# Patient Record
Sex: Male | Born: 1980 | Race: White | Hispanic: No | Marital: Married | Smoking: Never smoker
Health system: Southern US, Community
[De-identification: ages and names within clinical notes are randomized; demographics above are authoritative.]

## PROBLEM LIST (undated history)

## (undated) DIAGNOSIS — M4310 Spondylolisthesis, site unspecified: Secondary | ICD-10-CM

## (undated) HISTORY — DX: Spondylolisthesis, site unspecified: M43.10

---

## 2002-12-16 ENCOUNTER — Encounter: Payer: Self-pay | Admitting: Pulmonary Disease

## 2002-12-30 ENCOUNTER — Ambulatory Visit (HOSPITAL_BASED_OUTPATIENT_CLINIC_OR_DEPARTMENT_OTHER): Admission: RE | Admit: 2002-12-30 | Discharge: 2002-12-30 | Payer: Self-pay | Admitting: Pulmonary Disease

## 2002-12-30 ENCOUNTER — Encounter: Payer: Self-pay | Admitting: Pulmonary Disease

## 2002-12-31 ENCOUNTER — Encounter: Payer: Self-pay | Admitting: Pulmonary Disease

## 2003-01-27 ENCOUNTER — Encounter: Payer: Self-pay | Admitting: Pulmonary Disease

## 2003-01-30 ENCOUNTER — Encounter: Admission: RE | Admit: 2003-01-30 | Discharge: 2003-01-30 | Payer: Self-pay | Admitting: Pulmonary Disease

## 2004-07-13 ENCOUNTER — Ambulatory Visit: Payer: Self-pay | Admitting: Internal Medicine

## 2004-08-29 ENCOUNTER — Ambulatory Visit: Payer: Self-pay | Admitting: Pulmonary Disease

## 2005-06-23 ENCOUNTER — Ambulatory Visit: Payer: Self-pay | Admitting: Pulmonary Disease

## 2005-06-28 ENCOUNTER — Ambulatory Visit: Payer: Self-pay | Admitting: Internal Medicine

## 2005-08-22 ENCOUNTER — Ambulatory Visit: Payer: Self-pay | Admitting: Internal Medicine

## 2005-09-08 ENCOUNTER — Ambulatory Visit: Payer: Self-pay | Admitting: Internal Medicine

## 2006-04-11 ENCOUNTER — Ambulatory Visit: Payer: Self-pay | Admitting: Internal Medicine

## 2006-10-04 ENCOUNTER — Ambulatory Visit: Payer: Self-pay | Admitting: Internal Medicine

## 2006-11-01 ENCOUNTER — Ambulatory Visit: Payer: Self-pay | Admitting: Internal Medicine

## 2006-11-02 ENCOUNTER — Ambulatory Visit: Payer: Self-pay | Admitting: Cardiology

## 2007-01-01 ENCOUNTER — Ambulatory Visit: Payer: Self-pay | Admitting: Internal Medicine

## 2007-02-28 DIAGNOSIS — R51 Headache: Secondary | ICD-10-CM | POA: Insufficient documentation

## 2007-02-28 DIAGNOSIS — R519 Headache, unspecified: Secondary | ICD-10-CM | POA: Insufficient documentation

## 2007-02-28 DIAGNOSIS — J309 Allergic rhinitis, unspecified: Secondary | ICD-10-CM | POA: Insufficient documentation

## 2007-02-28 DIAGNOSIS — H6980 Other specified disorders of Eustachian tube, unspecified ear: Secondary | ICD-10-CM | POA: Insufficient documentation

## 2007-03-04 ENCOUNTER — Encounter: Payer: Self-pay | Admitting: Internal Medicine

## 2007-03-12 ENCOUNTER — Encounter: Payer: Self-pay | Admitting: Internal Medicine

## 2007-03-14 ENCOUNTER — Telehealth (INDEPENDENT_AMBULATORY_CARE_PROVIDER_SITE_OTHER): Payer: Self-pay | Admitting: *Deleted

## 2007-05-02 ENCOUNTER — Ambulatory Visit: Payer: Self-pay | Admitting: Internal Medicine

## 2007-05-02 LAB — CONVERTED CEMR LAB: Rapid Strep: NEGATIVE

## 2007-12-26 ENCOUNTER — Telehealth (INDEPENDENT_AMBULATORY_CARE_PROVIDER_SITE_OTHER): Payer: Self-pay | Admitting: *Deleted

## 2008-01-24 ENCOUNTER — Ambulatory Visit: Payer: Self-pay | Admitting: Pulmonary Disease

## 2008-01-24 DIAGNOSIS — G47419 Narcolepsy without cataplexy: Secondary | ICD-10-CM | POA: Insufficient documentation

## 2008-01-31 ENCOUNTER — Ambulatory Visit: Payer: Self-pay | Admitting: Family Medicine

## 2008-01-31 DIAGNOSIS — J069 Acute upper respiratory infection, unspecified: Secondary | ICD-10-CM | POA: Insufficient documentation

## 2008-04-01 ENCOUNTER — Ambulatory Visit: Payer: Self-pay | Admitting: Family Medicine

## 2008-05-18 ENCOUNTER — Ambulatory Visit: Payer: Self-pay | Admitting: Internal Medicine

## 2008-05-19 ENCOUNTER — Ambulatory Visit: Payer: Self-pay | Admitting: Internal Medicine

## 2008-05-26 ENCOUNTER — Ambulatory Visit: Payer: Self-pay | Admitting: Internal Medicine

## 2008-06-15 ENCOUNTER — Ambulatory Visit: Payer: Self-pay | Admitting: Internal Medicine

## 2008-07-14 ENCOUNTER — Ambulatory Visit: Payer: Self-pay | Admitting: Internal Medicine

## 2008-07-17 ENCOUNTER — Telehealth (INDEPENDENT_AMBULATORY_CARE_PROVIDER_SITE_OTHER): Payer: Self-pay | Admitting: Internal Medicine

## 2008-07-20 ENCOUNTER — Encounter: Admission: RE | Admit: 2008-07-20 | Discharge: 2008-07-20 | Payer: Self-pay | Admitting: Occupational Medicine

## 2008-07-30 ENCOUNTER — Ambulatory Visit: Payer: Self-pay | Admitting: Internal Medicine

## 2008-07-31 ENCOUNTER — Encounter: Payer: Self-pay | Admitting: Internal Medicine

## 2008-08-13 ENCOUNTER — Ambulatory Visit: Payer: Self-pay | Admitting: Internal Medicine

## 2008-11-19 ENCOUNTER — Ambulatory Visit: Payer: Self-pay | Admitting: Internal Medicine

## 2009-01-26 ENCOUNTER — Ambulatory Visit: Payer: Self-pay | Admitting: Internal Medicine

## 2009-03-16 ENCOUNTER — Ambulatory Visit: Payer: Self-pay | Admitting: Internal Medicine

## 2009-04-13 ENCOUNTER — Telehealth (INDEPENDENT_AMBULATORY_CARE_PROVIDER_SITE_OTHER): Payer: Self-pay | Admitting: *Deleted

## 2009-04-23 ENCOUNTER — Telehealth: Payer: Self-pay | Admitting: Internal Medicine

## 2009-05-13 ENCOUNTER — Ambulatory Visit: Payer: Self-pay | Admitting: Internal Medicine

## 2009-08-31 ENCOUNTER — Ambulatory Visit: Payer: Self-pay | Admitting: Internal Medicine

## 2009-10-06 ENCOUNTER — Telehealth (INDEPENDENT_AMBULATORY_CARE_PROVIDER_SITE_OTHER): Payer: Self-pay | Admitting: *Deleted

## 2009-12-03 ENCOUNTER — Ambulatory Visit: Payer: Self-pay | Admitting: Internal Medicine

## 2010-03-02 ENCOUNTER — Ambulatory Visit: Payer: Self-pay | Admitting: Internal Medicine

## 2010-03-15 NOTE — Progress Notes (Signed)
Summary: breaking out.  Phone Note Call from Patient Call back at [phone]   Caller: Patient Call For: young Reason for Call: Talk to Nurse Summary of Call: pt breaking out, not sure if it is poison oak/ivey.  Itches and is spreading  little spots here and there.  Would like to get knocked out before next.Marland Kitchen [facility] [address] [address] [facility] [place] Initial call taken by: Eugene Gavia,  October 06, 2009 1:11 PM  Follow-up for Phone Call        Pt states he has some places on his arms that are red,  itching and are starting to spread a little. He states it does not look like poison ivey/oak. he has taken benadryl and used cortisone cream and this relieves the itching some, but the spots have began to spread a little. Pt states with is allergy history he was wanting an rx for steroids to help get rid of the rash before it gets any worse. Pt advised that CY is out of office this pm. Pt request for message to wait for CY to answer in Am. . Pt has f/u appt in Sept. Please advsie.  Carron Curie CMA  October 06, 2009 1:54 PM allergies: NKDA  Additional Follow-up for Phone Call Additional follow up Details #1::        Per CDY-give Pred 10mg  #20 take 4 x 2 days, 3 x 2 days, 2 x 2 days, 1 x 2 days, then stop. no refills.Reynaldo Minium CMA  October 07, 2009 1:37 PM      Additional Follow-up for Phone Call Additional follow up Details #2::    Spoke with pt and advised we are sending in rx for pred taper.  [facility] [address]. Follow-up by: Vernie Murders,  October 07, 2009 2:06 PM  New/Updated Medications: PREDNISONE 10 MG TABS (PREDNISONE) 4 each am x 2 days, 3 each am x 2 days, 2 each am x 2 days, 1 each am x 2 days, then stop Prescriptions: PREDNISONE 10 MG TABS (PREDNISONE) 4 each am x 2 days, 3 each am x 2 days, 2 each am x 2 days, 1 each am x 2 days, then stop  #20 x 0   Entered by:   Vernie Murders   Authorized by:   Waymon Budge MD   Signed by:   Vernie Murders on  10/07/2009   Method used:   Electronically to        [facility] [phone]* (retail)       [address] [place], [place]  [address]       Ph: [phone] or [phone]       Fax: [phone]   RxID:   [id number]

## 2010-03-15 NOTE — Assessment & Plan Note (Signed)
Summary: rov 4 months///kp   Primary Provider/Referring Provider:  Alphonsus Sias  CC:  4 month follow up visit-allergies are better this year;p using loratadine approx 12 a day to help with allergies. .  History of Present Illness: 07/30/08- Allergic rhinitis, hx Narcolepsy Giving own allergy vaccine. He's been very diligent, keeping records with no missed doses and no local reactions. Still some nasal congestion and pressure.He tried fluticasone. Narcolepsy mild, still managed with samples of Nuvigil. Off Clarinex-D his hadaches stopped. Uses Loratidine. Thinks raw carrots irritate throat.  January 26, 2009- Allergic rhinitis, Hx narcolepsy Gives own vaccine injections regularly- cites reduced headache as example of benefit,  and insignificant local reaction. Discussed vaccine advance, risk and epipen.  Sleep- pretty good. His firestation sleeps better. Hasn't needed much Nuvigil. We reviewed use of meds and good sleep hygiene. He used to use diet pills at prior station, to keep going. No longer needs that. Averages 1 cup coffee day.  May 13, 2009- Allergic rhinitis, hx narcolepsy Spring is his worst season, but better this year than last. Sneezing, watery eyes and nose, some congestion with no wheeze. He says he is taking at least 6 (six) loratadine twice daily to control this. Not needing decongestant. He continues allergy vaccine with no problems. Needs refill Epipen. Stopped fluticasone- no help. Used zycam nasal spray for congestion occasionally for help sleeping. Previously failed benefit from Astepro. Had used Zyrtec. Occasional frontal headache with yellow green mucus- improved. Ears pop. No cough or wheeze.  Current Medications (verified): 1)  Allergy Vaccine Go Adv To 1:10, Next Order. .... (W-E) 2)  Epipen 0.3 Mg/0.38ml (1:1000) Devi (Epinephrine Hcl (Anaphylaxis)) .... For Severe Allergic Reaction 3)  Vitamin B Complex  Tabs (B Complex Vitamins) .... Take 1 By Mouth Once Daily 4)   Provigil 100 Mg Tabs (Modafinil) .Marland Kitchen.. 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 Married Never Smoked Alcohol use-occ  Risk Factors: Smoking Status: never (01/01/2007)  Review of Systems      See HPI  The patient denies anorexia, fever, weight loss, weight gain, vision loss, decreased hearing, hoarseness, chest pain, syncope, dyspnea on exertion, peripheral edema, prolonged cough, headaches, hemoptysis, abdominal pain, and severe indigestion/heartburn.    Vital Signs:  Patient profile:   30 year old male Height:      72 inches Weight:      243.13 pounds BMI:     33.09 O2 Sat:      96 % on Room air Pulse rate:   84 / minute BP sitting:   122 / 78  (left arm) Cuff size:   large  Vitals Entered By: Reynaldo Minium CMA (May 13, 2009 9:32 AM)  O2 Flow:  Room air  Physical Exam  Additional Exam:  General: A/Ox3; pleasant and cooperative, NAD, SKIN: no rash, lesions NODES: no lymphadenopathy HEENT: [place]/AT, EOM- WNL, Conjuctivae- clear, PERRLA, mild periorbital edema, TM-WNL, Nose- clear, Throat- clear and wnl, Mallampati  III NECK: Supple w/ fair ROM, JVD- none, normal carotid impulses w/o bruits Thyroid-  CHEST: Clear to P&A HEART: RRR, no  m/g/r heard ABDOMEN:  ZOX:WRUE, nl pulses, no edema  NEURO: Grossly intact to observation      Impression & Recommendations:  Problem # 1:  ALLERGIC RHINITIS (ICD-477.9)  He is doing well with allergy vaccine and we will  refill Epipen as discussed. I want him to try backing off loratadine if possible. The following medications were removed from the medication list:    Fluticasone Propionate 50 Mcg/act Susp (Fluticasone propionate) .Marland Kitchen... 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  Other Orders: Est. Patient Level III (45409)  Patient Instructions: 1)  Please schedule a follow-up appointment in 6 months. 2)  Please try to back off some on the loratadine at least to be sure you really need that much. 3)  Consider trying alternative antihistamines like allegra 180/ fexofenadine, or Zyrtec/ cetirizine, to see if they work better. 4)  Continue allergy vaccine- Epipen refill sent to drug store 5)  Continue Provigil, but be sure you get enough sleep. Prescriptions: EPIPEN 0.3 MG/0.3ML (1:1000) DEVI (EPINEPHRINE HCL (ANAPHYLAXIS)) For severe allergic reaction  #1 x prn   Entered and Authorized by:   Waymon Budge MD   Signed by:   Waymon Budge MD on 05/13/2009   Method used:   Electronically to        [facility] [phone]* (retail)       [address] [place], [place]  [address]       Ph: [phone] or [phone]       Fax: [phone]   RxID:   [id number]

## 2010-03-15 NOTE — Miscellaneous (Signed)
Summary: Injection Orders / [facility]    Injection Orders / [facility]    Imported By: Lennie Odor 07/13/2009 16:13:11  _____________________________________________________________________  External Attachment:    Type:   Image     Comment:   External Document

## 2010-03-15 NOTE — Miscellaneous (Signed)
Summary: Injection record/[facility]  Injection record/[facility]   Imported By: Sherian Rein 07/06/2009 11:48:30  _____________________________________________________________________  External Attachment:    Type:   Image     Comment:   External Document

## 2010-03-15 NOTE — Progress Notes (Signed)
Summary: Provigil script  Phone Note Call from Patient Call back at [phone]   Caller: Patient Call For: young Reason for Call: Talk to Nurse Summary of Call: pt would like to go back on meds that he knows work for him.  Want to stop Nuvigil and start Provigil. Initial call taken by: Eugene Gavia,  April 23, 2009 10:05 AM  Follow-up for Phone Call        Pt c/o Nuvigil making  him SOB, face flushed, and bloated, like he can't take a full breath. Pt staets he cut pill in half, and symptoms were not as bad, but he still had them. Pt wants to go back on Provigil. Please advise. Carron Curie CMA  April 23, 2009 11:51 AM   Additional Follow-up for Phone Call Additional follow up Details #1::        I have changed to Provigil on med list. Please send. Additional Follow-up by: Waymon Budge MD,  April 26, 2009 1:05 PM    Additional Follow-up for Phone Call Additional follow up Details #2::    [facility] pt request. Carron Curie CMA  April 26, 2009 1:53 PM   New/Updated Medications: PROVIGIL 100 MG TABS (MODAFINIL) 1 daily as needed for narcolepsy Prescriptions: PROVIGIL 100 MG TABS (MODAFINIL) 1 daily as needed for narcolepsy  #30 x 5   Entered by:   Waymon Budge MD   Authorized by:   Pulmonary Triage   Signed by:   Waymon Budge MD on 04/26/2009   Method used:   Historical   RxID:   [id number]

## 2010-03-15 NOTE — Miscellaneous (Signed)
Summary: Injection Orders / [facility]    Injection Orders / [facility]    Imported By: Lennie Odor 07/22/2009 17:27:07  _____________________________________________________________________  External Attachment:    Type:   Image     Comment:   External Document

## 2010-03-15 NOTE — Assessment & Plan Note (Signed)
Summary: rov 6 months///kp   Primary Provider/Referring Provider:  Alphonsus Sias  CC:  6 month follow up visit-allergies; congestion in mornings and evenings..  History of Present Illness: January 26, 2009- Allergic rhinitis, Hx narcolepsy Gives own vaccine injections regularly- cites reduced headache as example of benefit,  and insignificant local reaction. Discussed vaccine advance, risk and epipen.  Sleep- pretty good. His firestation sleeps better. Hasn't needed much Nuvigil. We reviewed use of meds and good sleep hygiene. He used to use diet pills at prior station, to keep going. No longer needs that. Averages 1 cup coffee day.  May 13, 2009- Allergic rhinitis, hx narcolepsy Spring is his worst season, but better this year than last. Sneezing, watery eyes and nose, some congestion with no wheeze. He says he is taking at least 6 (six) loratadine twice daily to control this. Not needing decongestant. He continues allergy vaccine with no problems. Needs refill Epipen. Stopped fluticasone- no help. Used zycam nasal spray for congestion occasionally for help sleeping. Previously failed benefit from Astepro. Had used Zyrtec. Occasional frontal headache with yellow green mucus- improved. Ears pop. No cough or wheeze.  December 03, 2009-Allergic rhinitis, hx narcolepsy NurseCC: 6 month follow up visit-allergies; congestion in mornings and evenings. We had called in prednisone taper for pruritic rash in August. That was sufficient wjith no recurrence.  Narcolepsy f/u- he has been tireder. At least 1 cup coffee/ day but bothers prostate. Using Provigil 100 mg/ day. Nuvigil overstimulated. Estimates 7 hours avg sleep / 24. Gets more and naps if needed. He will ask wife if she notes more snore. 8 lb weight gain. More nasal congestion with the season. Uses Zycam more.  Continiues alergy vaccine at 1:10.     Preventive Screening-Counseling & Management  Alcohol-Tobacco     Smoking Status:  never  Current Medications (verified): 1)  Allergy Vaccine Go Adv To 1:10, Next Order. .... (W-E) 2)  Epipen 0.3 Mg/0.45ml (1:1000) Devi (Epinephrine Hcl (Anaphylaxis)) .... For Severe Allergic Reaction 3)  Provigil 100 Mg Tabs (Modafinil) .Marland Kitchen.. 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  The patient denies anorexia, fever, weight loss, vision loss, decreased hearing, hoarseness, chest pain, syncope, dyspnea on exertion, peripheral edema, prolonged cough, headaches, hemoptysis, abdominal pain, melena, severe indigestion/heartburn, abnormal bleeding, enlarged lymph nodes, and angioedema.    Vital Signs:  Patient profile:   30 year old male Height:      72 inches Weight:      244 pounds BMI:     33.21 O2 Sat:      96 % on Room air Pulse rate:   78 / minute BP sitting:   122 / 76  (right arm) Cuff size:   large  Vitals Entered By: Reynaldo Minium CMA (December 03, 2009 9:01 AM)  O2 Flow:  Room air  Physical Exam  Additional Exam:  General: A/Ox3; pleasant and cooperative, NAD, SKIN: no rash, lesions NODES: no lymphadenopathy HEENT: [place]/AT, EOM- WNL, Conjuctivae- clear, PERRLA, mild periorbital edema, TM-WNL, Nose- mucus bridging , Throat- clear and wnl, Mallampati  III NECK: Supple w/ fair ROM, JVD- none, normal  carotid impulses w/o bruits Thyroid-  CHEST: Clear to P&A HEART: RRR, no m/g/r heard ABDOMEN: medium build GEX:BMWU, nl pulses, no edema  NEURO: Grossly intact to observation      Impression & Recommendations:  Problem # 1:  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 He can work to get a little more sleep. I get no hx of sleep paralysis or cataplexy. We will have him up the Provigil dose to 200 mg as needed.  Problem # 2:  ALLERGIC RHINITIS (ICD-477.9)  We discussed meds including caution with decongestant sprays. He is limited with oral decongestants due to his prostatism.  He needs to do a lot better with regular use of a steroind nasal spray seasonally.  Allergy vaccine is continued.  His updated medication list for this problem includes:    Fluticasone Propionate 50 Mcg/act Susp (Fluticasone propionate) .Marland Kitchen... 1-2 sprays each nostril once daily at bedtime  Medications Added to Medication List This Visit: 1)  Provigil 200 Mg Tabs (Modafinil) .... 1/2-1 daily as needed alertness 2)  Fluticasone Propionate 50 Mcg/act Susp (Fluticasone propionate) .Marland Kitchen.. 1-2 sprays each nostril once daily at bedtime  Other Orders: Est. Patient Level IV (13244)  Patient Instructions: 1)  Please schedule a follow-up appointment in 6 months. 2)  Be sure you get as much sleep as you need.  3)  Try script for Provigil 200 mg- you can split it as needed 4)  Try regular use of fluticasone nasal spray 5)  Allegra/ fexofenadine 180 mg would be a good nonsedating antihistamine otc Prescriptions: PROVIGIL 200 MG TABS (MODAFINIL) 1/2-1 daily as needed alertness  #30 x 5   Entered and Authorized by:   Waymon Budge MD   Signed by:   Waymon Budge MD on 12/03/2009   Method used:   Print then Give to Patient   RxID:   [id number] EPIPEN 0.3 MG/0.3ML (1:1000) DEVI (EPINEPHRINE HCL (ANAPHYLAXIS)) For severe allergic  reaction  #1 x prn   Entered and Authorized by:   Waymon Budge MD   Signed by:   Waymon Budge MD on 12/03/2009   Method used:   [facility]ron[facility] to        [facility] [phone]* (retail)       [address] [place], [place]  [address]       Ph: [phone] or [phone]       Fax: [phone]   RxID:   [id number] FLUTICASONE PROPIONATE 50 MCG/ACT SUSP (FLUTICASONE PROPIONATE) 1-2 sprays each nostril once daily at bedtime  #1 x prn   Entered and Authorized by:   Waymon Budge MD   Signed by:   Waymon Budge MD on 12/03/2009   Method used:   Electronically to        [facility] [phone]* (retail)       [address] [place], [place]  [address]       Ph: [phone] or [phone]       Fax:  [phone]   RxID:   [id number]

## 2010-03-15 NOTE — Progress Notes (Signed)
Summary: rx for nuvigil  Phone Note Call from Patient Call back at Home Phone [phone] Call back at [phone] cell   Caller: Patient Call For: young Summary of Call: patient would like a prescription for Nuvgil. Dr wert gave him smaples of it, but never gave him a prescription. [facility] [address] [facility] [place].  Initial call taken by: Valinda Hoar,  April 13, 2009 12:42 PM  Follow-up for Phone Call        pt last saw Dr. Shelle Iron 01-24-2008 and was given samples of Nuvigil at that appt to try.  Pt never followed up with Dr. Shelle Iron.  Pt will need ov first with KC before getting rx.  [id number] .Marland KitchenAundra Millet Reynolds LPN  April 13, 6576 2:18 PM   pt says Dr. Maple Hudson is his doctor for everything now, said he discussed this with CDY at last visit in Dec. 2010.  Please submit request to CDY. Follow-up by: Eugene Gavia,  April 14, 2009 11:04 AM  Additional Follow-up for Phone Call Additional follow up Details #1::        Will forward message to CY.  Please advise if ok to refill Nuvigil or not.  Thanks.  Aundra Millet Reynolds LPN  April 15, 4694 4:49 PM     Additional Follow-up for Phone Call Additional follow up Details #2::    Per CDY-ok to give reill for Nuvigil as requested. Reynaldo Minium CMA  April 14, 2009 5:10 PM   [facility] [address] [facility] [place].  Spoke with pt and made aware this was done. Follow-up by: Vernie Murders,  April 14, 2009 5:15 PM  Prescriptions: NUVIGIL 250 MG TABS (ARMODAFINIL) Take one by mouth daily  #30 x 5   Entered by:   Vernie Murders   Authorized by:   Waymon Budge MD   Signed by:   Vernie Murders on 04/14/2009   Method used:   Telephoned to ...       [facility] [phone]* (retail)       [address] [place], [place]  [address]       Ph: [phone] or [phone]       Fax: [phone]   RxID:   [id number]

## 2010-06-07 ENCOUNTER — Ambulatory Visit (INDEPENDENT_AMBULATORY_CARE_PROVIDER_SITE_OTHER): Payer: 59 | Admitting: Internal Medicine

## 2010-06-07 ENCOUNTER — Encounter: Payer: Self-pay | Admitting: Internal Medicine

## 2010-06-07 VITALS — BP 118/78 | HR 71 | Ht 72.0 in | Wt 215.8 lb

## 2010-06-07 DIAGNOSIS — H6980 Other specified disorders of Eustachian tube, unspecified ear: Secondary | ICD-10-CM

## 2010-06-07 DIAGNOSIS — G47419 Narcolepsy without cataplexy: Secondary | ICD-10-CM

## 2010-06-07 DIAGNOSIS — J309 Allergic rhinitis, unspecified: Secondary | ICD-10-CM

## 2010-06-07 MED ORDER — MOMETASONE FUROATE 50 MCG/ACT NA SUSP: 2.0000 | Freq: Every day | NASAL | Status: DC

## 2010-06-07 MED ORDER — MODAFINIL 200 MG PO TABS: 200.0000 mg | ORAL_TABLET | Freq: Every day | ORAL | Status: DC

## 2010-06-07 NOTE — Assessment & Plan Note (Signed)
This parallels his seasonal rhinitis. Have discussed interaction between decongestants, sleep and Provigil.

## 2010-06-07 NOTE — Assessment & Plan Note (Signed)
Adequate control with Provigil. Cautioned on sleep hygiene and driving safety.

## 2010-06-07 NOTE — Patient Instructions (Signed)
You can try getting your allergy shot twice a week in peak season if needed.  Try sample/ script Nasonex steroid nasal spray  1-2 puffs each nostril once daily at bedtime  Refilled Provigil.

## 2010-06-07 NOTE — Progress Notes (Signed)
  Subjective:    Patient ID: [name], male    DOB: 06-Jul-1980, 30 y.o.   MRN: [id number]  HPI 58 yoM followed here for narcolepsy w/o cataplexy and also for allergic rhinitis with eustachian dysfunction. Last here December 03, 2009.  He first describes difficult early Spring with allergy- eye itch and water, palate itches. He was taking daily loratadine x 2, some PE, some benadryl. Most Springs he has trouble with the tree phase of early pollen. He notes less headache with congestion now. He continues allergy vaccine at 1:10 with no problems and he feels this has helped overall. He feels this Spring is past the hump. Had some epistasxis.  Narcolepsy managed ok. Uses Provigil some days. Took it today and says he could lie down and fall asleep. Ok with driving. 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  Review of Systems See HPI Constitutional:   No weight loss, night sweats,  Fevers, chills, fatigue, lassitude. HEENT: No- Difficulty swallowing,  Tooth/dental problems,  Sore throat,               CV:  No chest pain,  Orthopnea, PND, swelling in lower extremities, anasarca, dizziness, palpitations  GI  No heartburn, indigestion, abdominal pain, nausea, vomiting, diarrhea, change in bowel habits, loss of appetite  Resp: No shortness of breath with exertion or at rest.  No excess mucus, no productive cough,  No non-productive cough,  No coughing up of blood.  No change in color of mucus.  No wheezing.  No chest wall deformity  Skin: no rash or lesions.  GU: no dysuria, change in color of urine, no urgency or frequency.  No flank pain.  MS:  No joint pain or swelling.  No decreased range of motion.  No back pain.  Psych:  No change in mood or affect. No depression or anxiety.  No memory loss.      Objective:   Physical Exam General- Alert, Oriented, Affect-appropriate, Distress- none acute  Skin- rash-none, lesions- none, excoriation- none  Lymphadenopathy-  none  Head- atraumatic  Eyes- Gross vision intact, PERRLA, conjunctivae clear,  Ears- Hearing normal , canals, Tm L , R , OK  Nose- Crusting and tender from blowing with bloody crust in lateral aspect right nostril,  Throat- Mallampati II , mucosa clear , drainage- none, tonsils- atrophic  Neck- flexible , trachea midline, no stridor , thyroid nl, carotid no bruit  Chest - symmetrical excursion , unlabored     Heart/CV- RRR , no murmur , no gallop  , no rub, nl s1 s2                     - JVD- none , edema- none, stasis changes- none, varices- none     Lung- clear to P&A, wheeze- none, cough- none , dullness-none, rub- none     Chest wall- atraumatic, no scar  Abd- tender-no, distended-no, bowel sounds-present, HSM- no  Br/ Gen/ Rectal- Not done, not indicated  Extrem- cyanosis- none, clubbing, none, atrophy- none, strength- nl  Neuro- grossly intact to observation         Assessment & Plan:

## 2010-06-07 NOTE — Assessment & Plan Note (Signed)
Continues allergy vaccine. Seasonal flare now should pass. We will give one pred taper to hold after steroid safety talk, and try nasal steroid. He can also take his shot twice weekly if needed.

## 2010-06-28 NOTE — Assessment & Plan Note (Signed)
[name]                             PULMONARY OFFICE NOTE   [name], [name]                       MRN:          [id number]  DATE:11/01/2006                            DOB:          03/09/1980    PROBLEMS:  1. Allergic rhinitis.  2. Eustachian dysfunction.  3. History of narcolepsy (Dr. Shelle Iron).   HISTORY:  We reviewed his strong seasonal symptom pattern, emphasizing  springtime.  He did not see much benefit from Veramyst of Astelin.  Previous antihistamine and antihistamine decongestant combinations tend  to work for [name] and then gradually fade, leaving him again with an  uncomfortable pressure sensation in his head.  He had tried chronic  antibiotics in the past without benefit.  He is taking [name], a fat  burner which he says keeps him awake and replaces his need for Adderall.  He says once in awhile the pressure sensation in his head will release  transiently, and he will get flow of watery clear-to-brown nasal  discharge.   OBJECTIVE:  VITAL SIGNS:  Weight 245 pounds.  Blood pressure 134/84,  pulse 68, room air saturation 99%.  HEENT:  Breathing is unlabored.  No periorbital edema.  Pulse regular.   SKIN TEST:  Positive histamine, negative diluent controls.  Significant  positive puncture reactions, particularly for grass and tree pollens,  somewhat less for dust mite.   IMPRESSION:  1. Significant atopic rhinitis with eustachian dysfunction and      intermittent rhinosinusitis.  2. Narcolepsy being treated for now with sleep hygiene issues and the      stimulant effect of his weight loss product.   PLAN:  1. Limited CT scan of sinuses.  2. Decongestants as tolerated.  3. We discussed allergy vaccine goals, side effects, complications and      logistics.  He points out that he works for [employer] which      would be an obvious way to get his shots.  He is going to decide      whether he wants to try allergy vaccine and  will schedule a return      in 3-4 months, earlier p.r.n.     Dustin D. Maple Hudson, MD, Tonny Bollman, FACP  Electronically Signed    CDY/MedQ  DD: 11/01/2006  DT: 11/02/2006  Job #: [id number]   cc:   Karie Schwalbe, MD

## 2010-06-28 NOTE — Assessment & Plan Note (Signed)
[facility]                             PULMONARY OFFICE NOTE   NAME:[name], [name]                       MRN:          [id number]  DATE:10/04/2006                            DOB:          Jul 21, 1980    PROBLEM:  A 30 year old man who complains of head and chest congestion  all the time, worse in spring and fall pollen seasons.  Says if he  swallows or yawns, his ears pop, always feels stopped up in the nose,  mucus is clear, sometimes a little green.  In May and June of this year  he had a persistent maxillary and retro-orbital headaches, helped for a  while with Claritin D and then Alavert.  Occasionally he will feel a  sudden release with facial pressure and would drain some brown mucus.  Frequent throat clearing, sometimes sore throats.  Had ENT evaluation,  told his hearing was perfect but that he had some inflammation in my  tubes.   MEDICATIONS:  No regular medications at this time.   ALLERGIES:  No medication allergy.   REVIEW OF SYSTEMS:  Maxillary and bitemporal pressure headache, acid  indigestion with intermittent use in the past of Tagamet, sore throats,  nasal congestion, change in color of mucus.  He denies wheeze, cough,  chest pain or palpitation, hypersomnolence.   PAST MEDICAL HISTORY:  1. 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  He did very well      with Provigil but could not afford it and has now dropped off of      all medications.  2. Some question of restless leg syndrome.  3. Allergic rhinitis.  4. Chronic headaches.  5. No ENT surgery.  6. No history of asthma or pneumonia.  7. No history of intolerance to latex, contrast dye or aspirin.   SOCIAL HISTORY:  Never smoked.  Alcohol once or twice a month.  He is  not married.  No children.  Lives with girlfriend.  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  At those times, he wears a mask.   FAMILY HISTORY:  Grandmother with emphysema.  Father takes a medication  for throat clearing, otherwise patient does not know of anybody with  allergy or respiratory problems.   ENVIRONMENT:  He lives in a new house with crawl space, central air  conditioning, carpet, outdoor dogs, no smokers.   OBJECTIVE:  VITAL SIGNS:  Weight 244 pounds, blood pressure 132/80,  pulse 60, room air saturation 98%.  SKIN:  No rash.  ADENOPATHY:  None found.  GENERAL APPEARANCE:  He seems alert and appropriate now.  Neurologic is  unremarkable to observation.  HEENT:  Tonsils are 3+ enlarged without exudate.  Posterior pharynx is  reddened.  Nasal airway significant only for some mucus bridging with no  visible polyps or significant turbinate edema.  Voice quality is normal  with no stridor.  Ear exam was unremarkable.  CHEST:  Clear.  CARDIOVASCULAR:  Heart sounds regular without murmur.  EXTREMITIES:  No tremor or  restlessness.   IMPRESSION:  1. Eustachian dysfunction.  2. Allergic rhinitis and probable intermittent or low grade chronic      sinusitis.  3. Headaches, nonspecific.  4. Narcolepsy syndrome with daytime sleepiness managed mostly with      naps, noting that Provigil worked well but was too expensive and      that he has not been back yet to Dr. Shelle Iron.   PLAN:  1. Sample Veramyst.  2. Sample Astelin with appropriate medication use instructed.  3. Return off of antihistamines for skin testing.  4. Environmental precautions were discussed.   I appreciate the chance to start working with him.     Clinton D. Maple Hudson, MD, Tonny Bollman, FACP  Electronically Signed    CDY/MedQ  DD: 10/15/2006  DT: 10/15/2006  Job #: [id number]

## 2010-07-12 ENCOUNTER — Telehealth: Payer: Self-pay | Admitting: *Deleted

## 2010-07-12 NOTE — Telephone Encounter (Signed)
Opened in error

## 2010-07-18 ENCOUNTER — Encounter: Payer: Self-pay | Admitting: Internal Medicine

## 2010-07-19 ENCOUNTER — Encounter: Payer: Self-pay | Admitting: Internal Medicine

## 2010-07-19 ENCOUNTER — Ambulatory Visit (INDEPENDENT_AMBULATORY_CARE_PROVIDER_SITE_OTHER): Payer: 59 | Admitting: Internal Medicine

## 2010-07-19 VITALS — BP 138/80 | HR 76 | Temp 98.1°F | Ht 72.0 in | Wt 206.0 lb

## 2010-07-19 DIAGNOSIS — Z202 Contact with and (suspected) exposure to infections with a predominantly sexual mode of transmission: Secondary | ICD-10-CM | POA: Insufficient documentation

## 2010-07-19 NOTE — Assessment & Plan Note (Signed)
May have been exposed from his wife who left him Discussed safe sex now with regular condoms Will check for infection though

## 2010-07-19 NOTE — Progress Notes (Signed)
  Subjective:    Patient ID: [name], male    DOB: [birth year], 30 y.o.   MRN: [id number]  HPI Wife left him in January--no reason why Now she is seeing someone else--not sure if she cheated on him Wants to be checked for STDs  No symptoms No urethral discharge No urinary symptoms  Not depressed Now seeing someone else Using condoms "sometimes"  Current outpatient prescriptions:EPINEPHrine (EPIPEN 2-PAK) 0.3 mg/0.3 mL DEVI, Inject 0.3 mg into the muscle once.  , Disp: , Rfl: ;  modafinil (PROVIGIL) 200 MG tablet, Take 1 tablet (200 mg total) by mouth daily. Take 1/2 -1 daily as needed alertness, Disp: 30 tablet, Rfl: 5;  mometasone (NASONEX) 50 MCG/ACT nasal spray, 2 sprays by Nasal route daily. Every day at bedtime, Disp: 17 g, Rfl: prn DISCONTD: fluticasone (FLONASE) 50 MCG/ACT nasal spray, Place 1-2 sprays into the nose daily.  , Disp: , Rfl:   Past Medical History  Diagnosis Date  . Narcolepsy   . Spondylolisthesis   . Allergic rhinitis     No past surgical history on file.  Family History  Problem Relation Age of Onset  . Diabetes type I Mother   . Hypertension Mother   . Uterine cancer Mother   . Diabetes Mother   . Healthy Father   . Hypertension Paternal Grandmother   . Coronary artery disease Neg Hx   . Cancer Neg Hx     no colon or prostate    History   Social History  . Marital Status: Married    Spouse Name: N/A    Number of Children: N/A  . Years of Education: N/A   Occupational History  . EMS, Lawyer, and teaches EMT classes    Social History Main Topics  . Smoking status: Never Smoker   . Smokeless tobacco: Not on file  . Alcohol Use: Yes     occasional  . Drug Use: Not on file  . Sexually Active: Not on file   Other Topics Concern  . Not on file   Social History Narrative  . No narrative on file   Review of Systems Keeps up with Dr Maple Hudson for regular visits    Objective:   Physical Exam    Genitourinary: Penis normal.       Urethra normal          Assessment & Plan:

## 2010-07-20 ENCOUNTER — Encounter: Payer: Self-pay | Admitting: *Deleted

## 2010-07-20 LAB — GC/CHLAMYDIA PROBE AMP, GENITAL
Chlamydia, DNA Probe: NEGATIVE
GC Probe Amp, Genital: NEGATIVE

## 2010-07-20 LAB — HIV ANTIBODY (ROUTINE TESTING W REFLEX): HIV: NONREACTIVE

## 2010-07-20 LAB — RPR

## 2010-09-20 ENCOUNTER — Ambulatory Visit (INDEPENDENT_AMBULATORY_CARE_PROVIDER_SITE_OTHER): Payer: 59

## 2010-09-20 DIAGNOSIS — J309 Allergic rhinitis, unspecified: Secondary | ICD-10-CM

## 2010-11-10 ENCOUNTER — Ambulatory Visit: Payer: 59 | Admitting: Internal Medicine

## 2010-11-22 ENCOUNTER — Encounter: Payer: Self-pay | Admitting: Internal Medicine

## 2010-11-22 ENCOUNTER — Ambulatory Visit (INDEPENDENT_AMBULATORY_CARE_PROVIDER_SITE_OTHER): Payer: 59 | Admitting: Internal Medicine

## 2010-11-22 VITALS — BP 126/72 | HR 76 | Ht 72.0 in | Wt 212.0 lb

## 2010-11-22 DIAGNOSIS — J309 Allergic rhinitis, unspecified: Secondary | ICD-10-CM

## 2010-11-22 DIAGNOSIS — G47419 Narcolepsy without cataplexy: Secondary | ICD-10-CM

## 2010-11-22 NOTE — Patient Instructions (Signed)
Now with mild head cold is a good time to consider occasional use of a decongestant like sudafed or off the shelf phenylephrine (Sudafed-PE and others). Ok to use saline rinse whenever you need.  Consider throat lozenges to help reduce throat clearing  Call for Provigil when needed

## 2010-11-22 NOTE — Progress Notes (Signed)
Subjective:    Patient ID: [name], male    DOB: 1980-05-14, 30 y.o.   MRN: [id number]  HPI 14 yoM followed here for narcolepsy w/o cataplexy and also for allergic rhinitis with eustachian dysfunction. Last here December 03, 2009.  He first describes difficult early Spring with allergy- eye itch and water, palate itches. He was taking daily loratadine x 2, some PE, some benadryl. Most Springs he has trouble with the tree phase of early pollen. He notes less headache with congestion now. He continues allergy vaccine at 1:10 with no problems and he feels this has helped overall. He feels this Spring is past the hump. Had some epistasxis.  Narcolepsy managed ok. Uses Provigil some days. Took it today and says he could lie down and fall asleep. Ok with driving. 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   11/22/10- 30 yoM followed here for narcolepsy w/o cataplexy and also for allergic rhinitis with eustachian dysfunction. In the fall season he finds he is doing more throat clearing, more retro-orbital headache and sneezing. Today blowing out some green. Denies wheeze or chest tightness. He has not tried decongestants. He tried Nasonex for a month and saw no benefit. He has only felt he needed loratadine in the spring time until now. He is using saline rinse and continuing allergy vaccine but very rare decongestant nasal spray. His narcolepsy problem is managed with naps and Provigil. He uses to Provigil mostly on days when he has class, averaging 2 or 3 times a month. Sleeps well through the night. Tiredness is mainly related to heavy work days.  Review of Systems See HPI Constitutional:   No-   weight loss, night sweats, fevers, chills, fatigue, lassitude. HEENT:   No-  headaches, difficulty swallowing, tooth/dental problems, sore throat,       No-  sneezing, itching, ear ache, nasal congestion, post nasal drip,  CV:  No-   chest pain, orthopnea, PND, swelling in lower extremities,  anasarca, dizziness, palpitations Resp: No-   shortness of breath with exertion or at rest.              No-   productive cough,  No non-productive cough,  No-  coughing up of blood.              No-   change in color of mucus.  No- wheezing.   Skin: No-   rash or lesions. GI:  No-   heartburn, indigestion, abdominal pain, nausea, vomiting, diarrhea,                 change in bowel habits, loss of appetite GU: No-   dysuria, change in color of urine, no urgency or frequency.  No- flank pain. MS:  No-   joint pain or swelling.  No- decreased range of motion.  No- back pain. Neuro-  Psych:  No- change in mood or affect. No depression or anxiety.  No memory loss.     Objective:   Physical Exam OBJ   General- Alert, Oriented, Affect-appropriate, Distress- none acute Skin- rash-none, lesions- none, excoriation- none Lymphadenopathy- none Head- atraumatic            Eyes- Gross vision intact, PERRLA, conjunctivae clear secretions            Ears- Hearing, canals-normal            Nose- Clear, no-Septal dev, +mucus, polyps, erosion, perforation  Throat- Mallampati II , mucosa clear , drainage- none, tonsils- atrophic Neck- flexible , trachea midline, no stridor , thyroid nl, carotid no bruit Chest - symmetrical excursion , unlabored           Heart/CV- RRR , no murmur , no gallop  , no rub, nl s1 s2                           - JVD- none , edema- none, stasis changes- none, varices- none           Lung- clear to P&A, wheeze- none, cough- none , dullness-none, rub- none           Chest wall-  Abd- tender-no, distended-no, bowel sounds-present, HSM- no Br/ Gen/ Rectal- Not done, not indicated Extrem- cyanosis- none, clubbing, none, atrophy- none, strength- nl Neuro- grossly intact to observation

## 2010-11-24 ENCOUNTER — Encounter: Payer: Self-pay | Admitting: Internal Medicine

## 2010-11-24 NOTE — Assessment & Plan Note (Signed)
He has potential for rotating shift which is a concern for sleep hygiene, but he seems to be careful about this. 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 I cautioned him to avoid sedating antihistamines. We discussed combination of decongestants with Provigil.

## 2010-11-24 NOTE — Assessment & Plan Note (Signed)
He'll continue allergy vaccine. Encouraged more frequent use of saline nasal rinse. Don't think we need to add antibiotics at this time.

## 2011-01-16 ENCOUNTER — Ambulatory Visit (INDEPENDENT_AMBULATORY_CARE_PROVIDER_SITE_OTHER): Payer: 59 | Admitting: Internal Medicine

## 2011-01-16 ENCOUNTER — Telehealth: Payer: Self-pay | Admitting: *Deleted

## 2011-01-16 ENCOUNTER — Encounter: Payer: Self-pay | Admitting: Internal Medicine

## 2011-01-16 VITALS — BP 115/60 | HR 68 | Temp 98.2°F | Wt 210.0 lb

## 2011-01-16 DIAGNOSIS — J019 Acute sinusitis, unspecified: Secondary | ICD-10-CM

## 2011-01-16 MED ORDER — AMOXICILLIN 500 MG PO TABS: 1000.0000 mg | ORAL_TABLET | Freq: Two times a day (BID) | ORAL | Status: AC

## 2011-01-16 NOTE — Telephone Encounter (Signed)
Spoke to patient and appointment scheduled today at 12:30 with Dr. Alphonsus Sias.

## 2011-01-16 NOTE — Assessment & Plan Note (Signed)
Seems to have secondary bacterial infection Discussed analgesics Will Rx with amoxil

## 2011-01-16 NOTE — Progress Notes (Signed)
  Subjective:    Patient ID: [name], male    DOB: 1980/08/25, 30 y.o.   MRN: [id number]  HPI Started with cold, tickle in throat, slight green sputum Last about 2 weeks---seemed to be better but now can feel it still in his sinus  Head pressure Lots of green nasal discharge Some inflammation and irritation Drainage  Not as much cough Slight headache but not bad Some ear popping and pressure No fever No SOB  Some help from tylenol cold meds  Current Outpatient Prescriptions on File Prior to Visit  Medication Sig Dispense Refill  . EPINEPHrine (EPIPEN 2-PAK) 0.3 mg/0.3 mL DEVI Inject 0.3 mg into the muscle once.        . modafinil (PROVIGIL) 200 MG tablet Take 1 tablet (200 mg total) by mouth daily. Take 1/2 -1 daily as needed alertness  30 tablet  5    No Known Allergies  Past Medical History  Diagnosis Date  . Narcolepsy   . Spondylolisthesis   . Allergic rhinitis     No past surgical history on file.  Family History  Problem Relation Age of Onset  . Diabetes type I Mother   . Hypertension Mother   . Uterine cancer Mother   . Diabetes Mother   . Healthy Father   . Hypertension Paternal Grandmother   . Coronary artery disease Neg Hx   . Cancer Neg Hx     no colon or prostate    History   Social History  . Marital Status: Legally Separated    Spouse Name: N/A    Number of Children: N/A  . Years of Education: N/A   Occupational History  . EMS, Lawyer, and teaches EMT classes    Social History Main Topics  . Smoking status: Never Smoker   . Smokeless tobacco: Never Used  . Alcohol Use: Yes     occasional  . Drug Use: Not on file  . Sexually Active: Not on file   Other Topics Concern  . Not on file   Social History Narrative  . No narrative on file   Review of Systems No rash No nausea or vomiting Eating okay Some aching--esp in back    Objective:   Physical Exam  Constitutional: He appears  well-developed and well-nourished. No distress.  HENT:  Head: Normocephalic and atraumatic.  Right Ear: External ear normal.  Left Ear: External ear normal.  Mouth/Throat: Oropharynx is clear and moist. No oropharyngeal exudate.       Mild sinus tenderness Marked nasal inflammation  TMs normal  Neck: Normal range of motion. Neck supple.  Pulmonary/Chest: Effort normal and breath sounds normal. No respiratory distress. He has no wheezes. He has no rales.  Lymphadenopathy:    He has no cervical adenopathy.          Assessment & Plan:

## 2011-01-16 NOTE — Telephone Encounter (Signed)
Patient called stating that he needs an antibiotic. Patient states that he has sinus congestion that is green, achy and no fever. Patient would an appointment to be seen today. No appointments available.

## 2011-01-16 NOTE — Telephone Encounter (Signed)
Okay to add at 12:30PM or 4:15

## 2011-01-30 ENCOUNTER — Telehealth: Payer: Self-pay | Admitting: *Deleted

## 2011-01-30 NOTE — Telephone Encounter (Signed)
Okay to change to augmentin 875 bid  #20 x 0

## 2011-01-30 NOTE — Telephone Encounter (Signed)
Pt seen in office 12.03.12 for Sinusitis, given Amoxicillin; pt c/o no better, now requesting stronger ABX.

## 2011-01-31 ENCOUNTER — Other Ambulatory Visit: Payer: Self-pay | Admitting: Internal Medicine

## 2011-01-31 ENCOUNTER — Telehealth: Payer: Self-pay | Admitting: Internal Medicine

## 2011-01-31 MED ORDER — AMOXICILLIN-POT CLAVULANATE 875-125 MG PO TABS: 1.0000 | ORAL_TABLET | Freq: Two times a day (BID) | ORAL | Status: DC

## 2011-01-31 NOTE — Telephone Encounter (Signed)
Spoke with patient and advised results rx sent to pharmacy by e-script  

## 2011-02-03 NOTE — Telephone Encounter (Signed)
error 

## 2011-05-24 ENCOUNTER — Ambulatory Visit: Payer: 59 | Admitting: Internal Medicine

## 2011-09-26 ENCOUNTER — Ambulatory Visit (INDEPENDENT_AMBULATORY_CARE_PROVIDER_SITE_OTHER): Payer: 59 | Admitting: Family Medicine

## 2011-09-26 ENCOUNTER — Telehealth: Payer: Self-pay | Admitting: Internal Medicine

## 2011-09-26 ENCOUNTER — Encounter: Payer: Self-pay | Admitting: Family Medicine

## 2011-09-26 VITALS — BP 112/70 | HR 76 | Temp 98.3°F | Wt 202.0 lb

## 2011-09-26 DIAGNOSIS — R197 Diarrhea, unspecified: Secondary | ICD-10-CM

## 2011-09-26 LAB — COMPREHENSIVE METABOLIC PANEL
ALT: 27 U/L (ref 0–53)
AST: 21 U/L (ref 0–37)
Albumin: 4.6 g/dL (ref 3.5–5.2)
Alkaline Phosphatase: 56 U/L (ref 39–117)
BUN: 12 mg/dL (ref 6–23)
CO2: 25 mEq/L (ref 19–32)
Calcium: 9.2 mg/dL (ref 8.4–10.5)
Chloride: 102 mEq/L (ref 96–112)
Creatinine, Ser: 0.7 mg/dL (ref 0.4–1.5)
GFR: 136.97 mL/min (ref >60.00)
Glucose, Bld: 91 mg/dL (ref 70–99)
Potassium: 3.8 mEq/L (ref 3.5–5.1)
Sodium: 137 mEq/L (ref 135–145)
Total Bilirubin: 0.7 mg/dL (ref 0.3–1.2)
Total Protein: 7.5 g/dL (ref 6.0–8.3)

## 2011-09-26 LAB — CBC WITH DIFFERENTIAL/PLATELET
Basophils Absolute: 0 10*3/uL (ref 0.0–0.1)
Basophils Relative: 0.5 % (ref 0.0–3.0)
Eosinophils Absolute: 0.1 10*3/uL (ref 0.0–0.7)
Eosinophils Relative: 0.7 % (ref 0.0–5.0)
HCT: 47.8 % (ref 39.0–52.0)
Hemoglobin: 15.6 g/dL (ref 13.0–17.0)
Lymphocytes Relative: 13 % (ref 12.0–46.0)
Lymphs Abs: 1 10*3/uL (ref 0.7–4.0)
MCHC: 32.8 g/dL (ref 30.0–36.0)
MCV: 88.5 fl (ref 78.0–100.0)
Monocytes Absolute: 0.7 10*3/uL (ref 0.1–1.0)
Monocytes Relative: 9.4 % (ref 3.0–12.0)
Neutro Abs: 5.8 10*3/uL (ref 1.4–7.7)
Neutrophils Relative %: 76.4 % (ref 43.0–77.0)
Platelets: 159 10*3/uL (ref 150.0–400.0)
RBC: 5.39 Mil/uL (ref 4.22–5.81)
RDW: 12.9 % (ref 11.5–14.6)
WBC: 7.6 10*3/uL (ref 4.5–10.5)

## 2011-09-26 MED ORDER — ONDANSETRON HCL 4 MG PO TABS: 4.0000 mg | ORAL_TABLET | Freq: Three times a day (TID) | ORAL | Status: AC | PRN

## 2011-09-26 MED ORDER — MECLIZINE HCL 25 MG PO TABS: 25.0000 mg | ORAL_TABLET | Freq: Three times a day (TID) | ORAL | Status: DC | PRN

## 2011-09-26 NOTE — Progress Notes (Signed)
(  S) [name] is a 31 y.o. male with complaint of gastrointestinal symptoms of fevers, watery diarrhea, nausea, lightheadedness for 3 days. No blood in stool. Started on Sunday with body aches and fever of 100.5.  Became nauseated, no vomiting.  Any changes in head position caused sensation that room was spinning- this improved with sitting still.  Diarrhea stopped yesterday.  No abdomen is sore but no focal abdominal pain.  No h/o vertigo but does have h/o motion sickness.  Patient Active Problem List  Diagnosis  . Narcolepsy without cataplexy  . ALLERGIC RHINITIS  . HEADACHE, CHRONIC  . Acute sinusitis, unspecified   Past Medical History  Diagnosis Date  . Narcolepsy   . Spondylolisthesis   . Allergic rhinitis    No past surgical history on file. History  Substance Use Topics  . Smoking status: Never Smoker   . Smokeless tobacco: Never Used  . Alcohol Use: Yes     occasional   Family History  Problem Relation Age of Onset  . Diabetes type I Mother   . Hypertension Mother   . Uterine cancer Mother   . Diabetes Mother   . Healthy Father   . Hypertension Paternal Grandmother   . Coronary artery disease Neg Hx   . Cancer Neg Hx     no colon or prostate   No Known Allergies Current Outpatient Prescriptions on File Prior to Visit  Medication Sig Dispense Refill  . EPINEPHrine (EPIPEN 2-PAK) 0.3 mg/0.3 mL DEVI Inject 0.3 mg into the muscle once.        Marland Kitchen DISCONTD: modafinil (PROVIGIL) 200 MG tablet Take 1 tablet (200 mg total) by mouth daily. Take 1/2 -1 daily as needed alertness  30 tablet  5   The PMH, PSH, Social History, Family History, Medications, and allergies have been reviewed in [facility], and have been updated if relevant.  (O)  BP 112/70  Pulse 76  Temp 98.3 F (36.8 C)  Wt 202 lb (91.627 kg)  Physical exam reveals the patient appears well. Hydration status: well hydrated. Abdomen: abdomen is soft without significant tenderness, masses, organomegaly or  guarding. No nystagmus  (A) Viral Gastroenteritis with vertigo  (P) I have recommended small amounts clear fluids frequently, soups, juices, water and advance diet as tolerated. Meclizine and Zofran as needed. Return office visit if symptoms persist or worsen; I have alerted the patient to call if high fever, dehydration, marked weakness, fainting, increased abdominal pain, blood in stool or vomit. Orders Placed This Encounter  Procedures  . CBC with Differential  . Comprehensive metabolic panel

## 2011-09-26 NOTE — Telephone Encounter (Signed)
Caller: [name]/Patient; Patient Name: [name]; PCP: Tillman Abide; Best Callback Phone Number: [phone], woke up Sunday 09/24/11 with  flu like sx,  low grade fever, intermittent dizziness,  nausea, diarrhea, today 09/25/12 still weak and dizzy at times, but better, afebrile All emergent sx for Dizziness Protocol R/o Except for Sensations of spinning and not responsive to home care.  Appt scheduled 09/26/11 0900.

## 2011-09-26 NOTE — Patient Instructions (Addendum)
Nice to meet you. It sounds like you have a GI bug which triggered some vertigo.  Vertigo Vertigo means you feel like you or your surroundings are moving when they are not. Vertigo can be dangerous if it occurs when you are at work, driving, or performing difficult activities.  CAUSES  Vertigo occurs when there is a conflict of signals sent to your brain from the visual and sensory systems in your body. There are many different causes of vertigo, including:  Infections, especially in the inner ear or GI tract.   A bad reaction to a drug or misuse of alcohol and medicines.   Withdrawal from drugs or alcohol.   Rapidly changing positions, such as lying down or rolling over in bed.   A migraine headache.   Decreased blood flow to the brain.   Increased pressure in the brain from a head injury, infection, tumor, or bleeding.  SYMPTOMS  You may feel as though the world is spinning around or you are falling to the ground. Because your balance is upset, vertigo can cause nausea and vomiting. You may have involuntary eye movements (nystagmus). DIAGNOSIS  Vertigo is usually diagnosed by physical exam. If the cause of your vertigo is unknown, your caregiver may perform imaging tests, such as an MRI scan (magnetic resonance imaging). TREATMENT  Most cases of vertigo resolve on their own, without treatment. Depending on the cause, your caregiver may prescribe certain medicines. If your vertigo is related to body position issues, your caregiver may recommend movements or procedures to correct the problem. In rare cases, if your vertigo is caused by certain inner ear problems, you may need surgery. HOME CARE INSTRUCTIONS   Follow your caregiver's instructions.   Avoid driving.   Avoid operating heavy machinery.   Avoid performing any tasks that would be dangerous to you or others during a vertigo episode.   Tell your caregiver if you notice that certain medicines seem to be causing your  vertigo. Some of the medicines used to treat vertigo episodes can actually make them worse in some people.  SEEK IMMEDIATE MEDICAL CARE IF:   Your medicines do not relieve your vertigo or are making it worse.   You develop problems with talking, walking, weakness, or using your arms, hands, or legs.   You develop severe headaches.   Your nausea or vomiting continues or gets worse.   You develop visual changes.   A family member notices behavioral changes.   Your condition gets worse.  MAKE SURE YOU:  Understand these instructions.   Will watch your condition.   Will get help right away if you are not doing well or get worse.  Document Released: 11/09/2004 Document Revised: 01/19/2011 Document Reviewed: 08/18/2010 [facility] Patient Information 2012 [place], [place].

## 2011-10-26 ENCOUNTER — Telehealth: Payer: Self-pay | Admitting: Internal Medicine

## 2011-10-26 MED ORDER — MODAFINIL 200 MG PO TABS: 200.0000 mg | ORAL_TABLET | Freq: Every day | ORAL | Status: DC

## 2011-10-26 NOTE — Telephone Encounter (Signed)
Rx called in --pt aware of this and aware to keep appt for further refills.

## 2011-10-26 NOTE — Telephone Encounter (Signed)
Called, spoke with pt.   Requsting provigil 200 mg rx Last OV with Dr. Maple Hudson 11/22/10 -- asked to f/u in 6 months. We have scheduled him to see Dr. Maple Hudson on Dec 26, 2011 - date per pt's request. Provigil rx last given 06/07/10 #30 x 5.  Dr. Maple Hudson, pls advise if rx ok.  Thank you.    [facility] [address] in [place]  ** Pt requesting this be called in TODAY -- he is out.

## 2011-10-26 NOTE — Telephone Encounter (Signed)
Ok to give for 1 month and 1 refill, to last until he keeps appointment.

## 2011-10-27 ENCOUNTER — Telehealth: Payer: Self-pay | Admitting: Internal Medicine

## 2011-10-27 NOTE — Telephone Encounter (Signed)
Spoke with pt. He states went to pick up rx for provigil from [facility] [address] [place] and copay was 700$!! He states has needed PA for this in the past. I [facility] not see where [facility] sent [facility]. Will forward to Lawson Fiscal since doing PA's per her request, thanks!!

## 2011-10-27 NOTE — Telephone Encounter (Signed)
I have called [facility] for PA on the Provigil and this was APPROVED from 10/27/11 through 10/26/12. Pt and pharmacy have been notified fo the approval and [facility] will fax approval letter.

## 2011-12-26 ENCOUNTER — Encounter: Payer: Self-pay | Admitting: Internal Medicine

## 2011-12-26 ENCOUNTER — Ambulatory Visit (INDEPENDENT_AMBULATORY_CARE_PROVIDER_SITE_OTHER): Payer: 59 | Admitting: Internal Medicine

## 2011-12-26 VITALS — BP 106/70 | HR 83 | Ht 72.0 in | Wt 208.0 lb

## 2011-12-26 DIAGNOSIS — G47419 Narcolepsy without cataplexy: Secondary | ICD-10-CM

## 2011-12-26 DIAGNOSIS — J309 Allergic rhinitis, unspecified: Secondary | ICD-10-CM

## 2011-12-26 MED ORDER — MODAFINIL 200 MG PO TABS: 200.0000 mg | ORAL_TABLET | Freq: Every day | ORAL | Status: DC

## 2011-12-26 MED ORDER — AZELASTINE-FLUTICASONE 137-50 MCG/ACT NA SUSP: 1.0000 | Freq: Every day | NASAL | Status: DC

## 2011-12-26 NOTE — Patient Instructions (Addendum)
We have stopped allergy shots.  Script refill provigil  Sample Dymista nasal spray     Try 1-2 sprays each nostril once daily at bedtime.

## 2011-12-26 NOTE — Progress Notes (Signed)
Subjective:    Patient ID: [name], male    DOB: [birth year], 31 y.o.   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 Last here December 03, 2009.  He first describes difficult early Spring with allergy- eye itch and water, palate itches. He was taking daily loratadine x 2, some PE, some benadryl. Most Springs he has trouble with the tree phase of early pollen. He notes less headache with congestion now. He continues allergy vaccine at 1:10 with no problems and he feels this has helped overall. He feels this Spring is past the hump. Had some epistasxis.  Narcolepsy managed ok. Uses Provigil some days. Took it today and says he could lie down and fall asleep. Ok with driving. 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 In the fall season he finds he is doing more throat clearing, more retro-orbital headache and sneezing. Today blowing out some green. Denies wheeze or chest tightness. He has not tried decongestants. He tried Nasonex for a month and saw no benefit. He has only felt he needed loratadine in the spring time until now. He is using saline rinse and continuing allergy vaccine but very rare decongestant nasal spray. His narcolepsy problem is managed with naps and Provigil. He uses to Provigil mostly on days when he has class, averaging 2 or 3 times a month. Sleeps well through the night. Tiredness is mainly related to heavy work days.  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 Gets flu vax at work.           He drifted off and stopped allergy vaccine last year. Minimal rhinitis this year. Sleep- more tired lately, Has 53 week old son- up more at night. No cataplexy or sleep paralysis. 1/2 Provigil  works best of stimulants tried. Trying to get enough sleep- night and naps.   Review of Systems-See HPI Constitutional:   No-   weight loss, night sweats, fevers, chills, +fatigue, lassitude. HEENT:   No-  headaches, difficulty swallowing, tooth/dental problems, sore throat,       No-  sneezing, itching, ear ache, nasal congestion, post nasal drip,  CV:  No-   chest pain, orthopnea, PND, swelling in lower extremities, anasarca, dizziness, palpitations Resp: No-   shortness of breath with exertion or at rest.              No-   productive cough,  No non-productive cough,  No-  coughing up of blood.              No-   change in color of mucus.  No- wheezing.   Skin: No-   rash or lesions. GI:  No-   heartburn, indigestion, abdominal pain, nausea, vomiting,  GU:  MS:  No-   joint pain or swelling.   Neuro-  Psych:  No- change in mood or affect. No depression or anxiety.  No memory loss.  Objective:   Physical Exam OBJ   General- Alert, Oriented, Affect-appropriate, Distress- none acute. Trim Skin- rash-none, lesions- none, excoriation- none Lymphadenopathy- none Head- atraumatic            Eyes- Gross vision intact, PERRLA, conjunctivae clear secretions            Ears- Hearing, canals-normal  Nose- Clear, no-Septal dev, +mucus, polyps, erosion, perforation             Throat- Mallampati II , mucosa clear , drainage- none, tonsils- atrophic Neck- flexible , trachea midline, no stridor , thyroid nl, carotid no bruit Chest - symmetrical excursion , unlabored           Heart/CV- RRR , no murmur , no gallop  , no rub, nl s1 s2                           - JVD- none , edema- none, stasis changes- none, varices- none           Lung- clear to P&A, wheeze- none, cough- none , dullness-none, rub- none           Chest wall-  Abd-  Br/ Gen/ Rectal- Not done, not indicated Extrem- cyanosis- none, clubbing, none, atrophy- none, strength- nl Neuro- grossly intact to  observation

## 2012-01-03 NOTE — Assessment & Plan Note (Signed)
Plan-sample Dymista nasal spray for trial with discussion. 

## 2012-01-03 NOTE — Assessment & Plan Note (Signed)
Controlled with Provigil which continues to work well. Re-emphasized sleep hygiene and naps as new baby complicates his schedule.

## 2012-01-05 ENCOUNTER — Ambulatory Visit: Payer: 59 | Admitting: Internal Medicine

## 2012-01-19 ENCOUNTER — Encounter: Payer: Self-pay | Admitting: Internal Medicine

## 2012-01-19 ENCOUNTER — Ambulatory Visit (INDEPENDENT_AMBULATORY_CARE_PROVIDER_SITE_OTHER): Payer: 59 | Admitting: Internal Medicine

## 2012-01-19 VITALS — BP 130/90 | HR 72 | Temp 98.4°F | Wt 208.0 lb

## 2012-01-19 DIAGNOSIS — R198 Other specified symptoms and signs involving the digestive system and abdomen: Secondary | ICD-10-CM

## 2012-01-19 DIAGNOSIS — R194 Change in bowel habit: Secondary | ICD-10-CM | POA: Insufficient documentation

## 2012-01-19 DIAGNOSIS — Z23 Encounter for immunization: Secondary | ICD-10-CM

## 2012-01-19 LAB — CBC WITH DIFFERENTIAL/PLATELET
Basophils Absolute: 0 10*3/uL (ref 0.0–0.1)
Basophils Relative: 0.3 % (ref 0.0–3.0)
Eosinophils Absolute: 0 10*3/uL (ref 0.0–0.7)
Eosinophils Relative: 0.8 % (ref 0.0–5.0)
HCT: 45.1 % (ref 39.0–52.0)
Hemoglobin: 15.1 g/dL (ref 13.0–17.0)
Lymphocytes Relative: 26.7 % (ref 12.0–46.0)
Lymphs Abs: 1.6 10*3/uL (ref 0.7–4.0)
MCHC: 33.5 g/dL (ref 30.0–36.0)
MCV: 86.2 fl (ref 78.0–100.0)
Monocytes Absolute: 0.6 10*3/uL (ref 0.1–1.0)
Monocytes Relative: 9.8 % (ref 3.0–12.0)
Neutro Abs: 3.9 10*3/uL (ref 1.4–7.7)
Neutrophils Relative %: 62.4 % (ref 43.0–77.0)
Platelets: 187 10*3/uL (ref 150.0–400.0)
RBC: 5.23 Mil/uL (ref 4.22–5.81)
RDW: 12.9 % (ref 11.5–14.6)
WBC: 6.2 10*3/uL (ref 4.5–10.5)

## 2012-01-19 LAB — C-REACTIVE PROTEIN: CRP: <0.5 mg/dL (ref 0.5–20.0)

## 2012-01-19 LAB — SEDIMENTATION RATE: Sed Rate: 4 mm/hr (ref 0–22)

## 2012-01-19 NOTE — Assessment & Plan Note (Signed)
No systemic symptoms and no fistula found but RLQ tenderness is worrisome for possible Crohn's (mild) Will check labs If elevated sed rate/crp---- will check UGI and SBFT If persistent symptoms, would check also Can try fiber

## 2012-01-19 NOTE — Patient Instructions (Signed)
Please try a fiber supplement. If your leakage continues, please call and we will set up the special x-ray test

## 2012-01-19 NOTE — Progress Notes (Signed)
  Subjective:    Patient ID: [name], male    DOB: [birth year], 31 y.o.   MRN: [id number]  HPI Having problems with stool leakage Will move bowels in AM Then some days will have sense of leakage and he has to wipe himself Nothing on underwear  Stools are solid and regular No diarrhea but occ gets some IBS---some pain and urgency suddenly and then better after moves bowels No blood in stool but occ gets on toilet paper from hemorrhoid  Current Outpatient Prescriptions on File Prior to Visit  Medication Sig Dispense Refill  . EPINEPHrine (EPIPEN 2-PAK) 0.3 mg/0.3 mL DEVI Inject 0.3 mg into the muscle once.        . modafinil (PROVIGIL) 200 MG tablet Take 1 tablet (200 mg total) by mouth daily. Take 1/2 -1 daily as needed alertness  30 tablet  1    No Known Allergies  Past Medical History  Diagnosis Date  . Narcolepsy   . Spondylolisthesis   . Allergic rhinitis     No past surgical history on file.  Family History  Problem Relation Age of Onset  . Diabetes type I Mother   . Hypertension Mother   . Uterine cancer Mother   . Diabetes Mother   . Healthy Father   . Hypertension Paternal Grandmother   . Coronary artery disease Neg Hx   . Cancer Neg Hx     no colon or prostate    History   Social History  . Marital Status: Divorced    Spouse Name: N/A    Number of Children: 1  . Years of Education: N/A   Occupational History  . 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 Smoking status: Never Smoker   . Smokeless tobacco: Never Used  . Alcohol Use: Yes     Comment: occasional  . Drug Use: Not on file  . Sexually Active: Not on file   Other Topics Concern  . Not on file   Social History Narrative   DivorcedReady to remarry   Review of Systems Rare abdominal pain after eating--a few minutes and goes away Appetite is good Has gained back some weight while partner has been pregnant     Objective:   Physical Exam  Constitutional: He appears well-developed and well-nourished. No distress.  Abdominal: Soft. Bowel sounds are normal. He exhibits no distension and no mass. There is tenderness. There is no rebound and no guarding.       Mild RLQ tenderness  Genitourinary:       No obvious fistula No sig hemorrhoids No masses Stool brown and heme negative          Assessment & Plan:

## 2012-01-19 NOTE — Addendum Note (Signed)
Addended by: Eliezer Bottom on: 01/19/2012 08:48 AM   Modules accepted: Orders

## 2012-01-24 ENCOUNTER — Encounter: Payer: Self-pay | Admitting: *Deleted

## 2012-02-14 HISTORY — PX: VASECTOMY: SHX75

## 2012-03-08 ENCOUNTER — Encounter: Payer: Self-pay | Admitting: Internal Medicine

## 2012-09-21 ENCOUNTER — Emergency Department: Payer: Self-pay | Admitting: Emergency Medicine

## 2012-12-26 ENCOUNTER — Encounter: Payer: Self-pay | Admitting: Internal Medicine

## 2012-12-26 ENCOUNTER — Ambulatory Visit (INDEPENDENT_AMBULATORY_CARE_PROVIDER_SITE_OTHER): Payer: 59 | Admitting: Internal Medicine

## 2012-12-26 VITALS — BP 130/90 | HR 85 | Ht 72.0 in | Wt 213.0 lb

## 2012-12-26 DIAGNOSIS — J309 Allergic rhinitis, unspecified: Secondary | ICD-10-CM

## 2012-12-26 DIAGNOSIS — G47419 Narcolepsy without cataplexy: Secondary | ICD-10-CM

## 2012-12-26 NOTE — Patient Instructions (Addendum)
Glad you are doing so well. Still try to get enough sleep- not while you are behind the wheel  Non-sedating antihistamines like Claritin/ loratadine, or Allergra/ fexofenadine, would be better choices than benadryl, which is usually sedating.   Flu vax  Please call as needed

## 2012-12-26 NOTE — Progress Notes (Signed)
Subjective:    Patient ID: [name], male    DOB: 28-Apr-1980, 32 y.o.   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 Last here December 03, 2009.  He first describes difficult early Spring with allergy- eye itch and water, palate itches. He was taking daily loratadine x 2, some PE, some benadryl. [place] he has trouble with the tree phase of early pollen. He notes less headache with congestion now. He continues allergy vaccine at 1:10 with no problems and he feels this has helped overall. He feels this Spring is past the hump. Had some epistasxis.  Narcolepsy managed ok. Uses Provigil some days. Took it today and says he could lie down and fall asleep. Ok with driving. 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 In the fall season he finds he is doing more throat clearing, more retro-orbital headache and sneezing. Today blowing out some green. Denies wheeze or chest tightness. He has not tried decongestants. He tried Nasonex for a month and saw no benefit. He has only felt he needed loratadine in the spring time until now. He is using saline rinse and continuing allergy vaccine but very rare decongestant nasal spray. His narcolepsy problem is managed with naps and Provigil. He uses to Provigil mostly on days when he has class, averaging 2 or 3 times a month. Sleeps well through the night. Tiredness is mainly related to heavy work days.  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 Gets flu vax at work.           He drifted off and stopped allergy vaccine last year. Minimal rhinitis this year. Sleep- more tired lately, Has 54 week old son- up more at night. No cataplexy or sleep paralysis. 1/2 Provigil  works best of stimulants tried. Trying to get enough sleep- night and naps.   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 FOLLOWS FOR:no longer on Provigil- stays alert  and sleeping well. No longer on vaccine for allergies. 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 He says alertness is fine as long as he is active. Sleeps if he sits quietly. Denies any problems driving. Rhinitis much better, just using occasional antihistamine. Occasional nasal congestion.  Review of Systems-See HPI Constitutional:   No-   weight loss, night sweats, fevers, chills, fatigue, lassitude. HEENT:   No-  headaches, difficulty swallowing, tooth/dental problems, sore throat,       No-  sneezing, itching, ear ache, +nasal congestion, post nasal drip,  CV:  No-   chest pain, orthopnea, PND, swelling in lower extremities, anasarca, dizziness, palpitations Resp: No-   shortness of breath with exertion or at rest.              No-   productive cough,  No non-productive cough,  No-  coughing up of blood.              No-   change in color of mucus.  No- wheezing.   Skin: No-   rash or lesions. GI:  No-   heartburn, indigestion, abdominal pain, nausea, vomiting,  GU:  MS:  No-   joint pain or swelling.  Neuro-  Psych:  No- change in mood or affect. No depression or anxiety.  No memory loss.  Objective:   Physical Exam OBJ   General- Alert, Oriented, Affect-appropriate, Distress- none acute. Trim Skin- rash-none, lesions- none, excoriation- none Lymphadenopathy- none Head- atraumatic            Eyes- Gross vision intact, PERRLA, conjunctivae clear secretions            Ears- Hearing, canals-normal            Nose- Clear, no-Septal dev, +mucus, polyps, erosion, perforation             Throat- Mallampati II , mucosa clear , drainage- none, tonsils+ Neck- flexible , trachea midline, no stridor , thyroid nl, carotid no  bruit Chest - symmetrical excursion , unlabored           Heart/CV- RRR , no murmur , no gallop  , no rub, nl s1 s2                           - JVD- none , edema- none, stasis changes- none, varices- none           Lung- clear to P&A, wheeze- none, cough- none , dullness-none, rub- none           Chest wall-  Abd-  Br/ Gen/ Rectal- Not done, not indicated Extrem- cyanosis- none, clubbing, none, atrophy- none, strength- nl Neuro- grossly intact to observation

## 2013-01-12 ENCOUNTER — Encounter: Payer: Self-pay | Admitting: Internal Medicine

## 2013-01-12 NOTE — Assessment & Plan Note (Signed)
Narcolepsy symptoms are not evident by his description at this time. It is unusual for narcolepsy to resolve. This needs to be watched. I am pleased that he is not needing prescription stimulants. Emphasis on good sleep hygiene, his responsibility to drive safely.

## 2013-01-12 NOTE — Assessment & Plan Note (Signed)
Currently controlled with non- sedating antihistamine

## 2013-06-19 ENCOUNTER — Other Ambulatory Visit: Payer: Self-pay | Admitting: Family Medicine

## 2013-06-19 ENCOUNTER — Other Ambulatory Visit: Payer: Self-pay

## 2013-06-19 MED ORDER — MECLIZINE HCL 25 MG PO TABS: 25.0000 mg | ORAL_TABLET | Freq: Three times a day (TID) | ORAL | Status: AC | PRN

## 2013-06-19 NOTE — Telephone Encounter (Signed)
Okay #[phone] x 0 I have known him for years and years

## 2013-06-19 NOTE — Telephone Encounter (Signed)
Pt left v/m, having problem with vertigo today and pt request refill meclizine to [facility]. Pt request cb. Pt last seen 01/19/2012 and no future appt scheduled.

## 2013-06-19 NOTE — Telephone Encounter (Signed)
rx sent to pharmacy by e-script  

## 2013-12-17 ENCOUNTER — Encounter: Payer: Self-pay | Admitting: Internal Medicine

## 2014-03-05 ENCOUNTER — Ambulatory Visit (INDEPENDENT_AMBULATORY_CARE_PROVIDER_SITE_OTHER): Payer: 59 | Admitting: Internal Medicine

## 2014-03-05 ENCOUNTER — Encounter: Payer: Self-pay | Admitting: Internal Medicine

## 2014-03-05 VITALS — BP 132/84 | HR 72 | Temp 98.6°F | Ht 72.0 in | Wt 216.4 lb

## 2014-03-05 DIAGNOSIS — R42 Dizziness and giddiness: Secondary | ICD-10-CM | POA: Insufficient documentation

## 2014-03-05 MED ORDER — MECLIZINE HCL 25 MG PO TABS: 25.0000 mg | ORAL_TABLET | Freq: Three times a day (TID) | ORAL | Status: DC | PRN

## 2014-03-05 NOTE — Assessment & Plan Note (Signed)
Does seem vestibular No signs of CNS problems---normal neuro exam Fatigue only since vertigo starting--may be related to the meclizine Discussed using it tid regularly for a few days---then decreasing gradually

## 2014-03-05 NOTE — Progress Notes (Signed)
Pre visit review using our clinic review tool, if applicable. No additional management support is needed unless otherwise documented below in the visit note. 

## 2014-03-05 NOTE — Progress Notes (Signed)
   Subjective:    Patient ID: [name], male    DOB: 1981/01/07, 34 y.o.   MRN: [id number]  HPI Started with some dizziness upon standing 2 days ago Then could tell it was like vertigo Has had before--seems to be once a year or so. Usually only lasts for 1 day  Took 1 meclizine 25mg  2 days ago--not much better Mild nausea with it Took another 25 and did give fair relief  Has been more tired lately Sleeping more Some better yesterday--didn't need meclizine yesterday Got up this morning and had bad vertigo---things were really spinning Took 50mg  of meclizine and is better through the day Still feels legs are weak  No current outpatient prescriptions on file prior to visit.   No current facility-administered medications on file prior to visit.    No Known Allergies  Past Medical History  Diagnosis Date  . Narcolepsy   . Spondylolisthesis   . Allergic rhinitis     Past Surgical History  Procedure Laterality Date  . Vasectomy  1/14    Dr Jacqlyn Larsen    Family History  Problem Relation Age of Onset  . Diabetes type I Mother   . Hypertension Mother   . Uterine cancer Mother   . Diabetes Mother   . Healthy Father   . Hypertension Paternal Grandmother   . Coronary artery disease Neg Hx   . Cancer Neg Hx     no colon or prostate    History   Social History  . Marital Status: Married    Spouse Name: N/A    Number of Children: 1  . Years of Education: N/A   Occupational History  . [employer]  .      Social History Main Topics  . Smoking status: Never Smoker   . Smokeless tobacco: Never Used  . Alcohol Use: 0.0 oz/week    0 Not specified per week     Comment: occasional  . Drug Use: Not on file  . Sexual Activity: Not on file   Other Topics Concern  . Not on file   Social History Narrative   Married 2nd time   5 step children   1 child   Review of Systems Just started at [employer]--- wife worried he has some nerves with  this Some upset stomach More freq stools--- 1 son has stomach bug No tinnitus or hearing loss Slight headache--just some pressure    Objective:   Physical Exam  Constitutional: He appears well-developed and well-nourished. No distress.  HENT:  Mouth/Throat: Oropharynx is clear and moist. No oropharyngeal exudate.  Canals and TMs normal  Eyes: EOM are normal. Pupils are equal, round, and reactive to light.  Fundi sharp Horizontal nystagmus with lateral gaze  Neck: Normal range of motion. Neck supple. No thyromegaly present.  Neurological: He is alert. He has normal strength. He displays no tremor. No cranial nerve deficit. He exhibits normal muscle tone. He displays a negative Romberg sign. Coordination and gait normal.          Assessment & Plan:

## 2016-03-23 DIAGNOSIS — L738 Other specified follicular disorders: Secondary | ICD-10-CM | POA: Diagnosis not present

## 2016-03-23 DIAGNOSIS — L309 Dermatitis, unspecified: Secondary | ICD-10-CM | POA: Diagnosis not present

## 2016-03-23 DIAGNOSIS — K130 Diseases of lips: Secondary | ICD-10-CM | POA: Diagnosis not present

## 2016-10-02 ENCOUNTER — Telehealth: Payer: Self-pay | Admitting: Internal Medicine

## 2016-10-02 NOTE — Telephone Encounter (Deleted)
Patient Name: [name]  Gender: Male  DOB: 03/13/1924   Age: 36 Y 49 M 23 D  Return Phone Number: [phone] (Primary)  Address: [address]   City/State/Zip: [place] [address]   Client [facility] - Client  Client Site [facility] - Day  Physician Viviana Simpler - MD  Contact Type Call  Who Is Calling Patient / Member / Family / Caregiver  Call Type Triage / Clinical  Caller Name Mrs. Kathrynn Humble  Relationship To Patient Spouse  Return Phone Number [phone] (Primary)  Chief Complaint WEAKNESS - sudden on one side of face or body  Reason for Call Symptomatic / Request for [place] states husband is extremely weak, hurting all over and feels terrible   Appointment Disposition EMR Appointment Not Necessary  Info pasted into Epic Yes  PreDisposition Call another nurse  Translation No   Nurse Assessment  Nurse: Arthor Captain, RN, Joycelyn Schmid Date/Time (Eastern Time): 10/02/2016 3:25:10 PM  Confirm and document reason for call. If symptomatic, describe symptoms. ---Caller states that husband is extremely weak, feels terrible and hurting all over. Has been in bed all day. Denies fever. BS is 98. Symptoms started yesterday. BP is 138/60. Having trouble initiating urination. Denies pain when urinating.  Does the patient have any new or worsening symptoms? ---Yes  Will a triage be completed? ---Yes  Related visit to physician within the last 2 weeks? ---No  Does the PT have any chronic conditions? (i.e. diabetes, asthma, etc.) ---No  Is this a behavioral health or substance abuse call? ---No     Guidelines      Guideline Title Affirmed Question Affirmed Notes Nurse Date/Time (Eastern Time)  Urinary Symptoms [1] Unable to urinate (or only a few drops) > 4 hours AND [2] bladder feels very full (e.g., palpable bladder or strong urge to urinate)  Cockrum, RN, Joycelyn Schmid 10/02/2016 3:28:32 PM   Disp.  Time Eilene Ghazi Time) Disposition Final User   10/02/2016 3:21:29 PM Send to Urgent Tawanna Cooler   10/02/2016 3:44:52 PM Called On-Call Provider  Cockrum, RN, Joycelyn Schmid    Reason: Spoke to nurse at office and informed her that patient refused an ED outcome gave report on patient.      10/02/2016 3:47:00 PM Send To RN Personal  Cockrum, RN, Margaret   10/02/2016 3:57:36 PM Send To RN Personal  Cockrum, RN, Joycelyn Schmid    10/02/2016 3:47:17 PM Go to ED Now Yes Cockrum, RN, Jamesetta Geralds Understands: Yes  Disagree/Comply: Disagree  Disagree/Comply Reason: Wait and see   Care Advice Given Per Guideline      GO TO ED NOW: You need to be seen in the Emergency Department. Go to the ER at ___________ [place] now. Drive carefully. CARE ADVICE given per Urinary Symptoms (Adult) guideline.   Comments  User: Chinita Pester, RN Date/Time Eilene Ghazi Time): 10/02/2016 3:35:21 PM  Patient refuses ED outcome and requests a call back   Referrals  GO TO FACILITY REFUSED

## 2016-10-02 NOTE — Telephone Encounter (Signed)
Mr [name] said that he had a vasectomy 5 yrs ago and urologist cannot see pt for awhile; pt started with testicular swelling 3 days ago and 2 days ago started with pain in testicle. Pt can urinate OK but the pt is in a lot of pain now. Pt also wants to know if [place] could be ordered. Advised pt no; he needs to be evaluated now and pt can go to [facility] or ED. Pt will go to [facility] ED. Avie Echevaria NP notified verbally and agreed pt should be eval now.  Note: the note in chart from [facility] is the wrong pt and Junie Panning is contacting IT to have removed.

## 2016-11-16 DIAGNOSIS — K130 Diseases of lips: Secondary | ICD-10-CM | POA: Diagnosis not present

## 2016-11-16 DIAGNOSIS — L249 Irritant contact dermatitis, unspecified cause: Secondary | ICD-10-CM | POA: Diagnosis not present

## 2017-03-16 ENCOUNTER — Ambulatory Visit: Payer: 59 | Admitting: Internal Medicine

## 2017-03-16 ENCOUNTER — Telehealth: Payer: Self-pay

## 2017-03-16 DIAGNOSIS — Z0289 Encounter for other administrative examinations: Secondary | ICD-10-CM

## 2017-03-16 NOTE — Telephone Encounter (Signed)
Patient No Showed/ Canceled appt within 24 hours.   Please determine one of the following actions:   A. No follow-up necessary.  B. Follow-up urgent. Locate patient immediately.  C. Follow-up necessary. Contact patient and schedule visit in _______ days.  D. Follow-up advised. Contact patient and schedule visit in _______ weeks.    Please determine CHARGE or NO CHARGE.   Thank you,

## 2017-03-16 NOTE — Telephone Encounter (Signed)
I spoke with pt and rt foot pain is gone now and will cb to schedule if reoccurs.

## 2017-03-16 NOTE — Telephone Encounter (Signed)
Follow up prn ---not sure if this was just a routine PE or acute care I prefer not to charge, unless he has no showed before

## 2017-03-16 NOTE — Telephone Encounter (Signed)
Copied from [place]. Topic: Quick Communication - Appointment Cancellation >> Mar 16, 2017  8:21 AM [name] wrote: Patient called to cancel appointment scheduled for 03/16/17. Patient has not rescheduled their appointment.    Route to department's PEC pool.

## 2017-05-23 DIAGNOSIS — L249 Irritant contact dermatitis, unspecified cause: Secondary | ICD-10-CM | POA: Diagnosis not present

## 2017-05-23 DIAGNOSIS — L259 Unspecified contact dermatitis, unspecified cause: Secondary | ICD-10-CM | POA: Diagnosis not present

## 2017-05-23 DIAGNOSIS — K130 Diseases of lips: Secondary | ICD-10-CM | POA: Diagnosis not present

## 2017-05-24 ENCOUNTER — Encounter: Payer: Self-pay | Admitting: *Deleted

## 2017-05-24 ENCOUNTER — Other Ambulatory Visit: Payer: Self-pay

## 2017-05-24 ENCOUNTER — Ambulatory Visit: Payer: 59 | Admitting: Family Medicine

## 2017-05-24 ENCOUNTER — Encounter: Payer: Self-pay | Admitting: Family Medicine

## 2017-05-24 VITALS — BP 100/70 | HR 56 | Temp 98.6°F | Ht 70.5 in | Wt 205.5 lb

## 2017-05-24 DIAGNOSIS — Z0289 Encounter for other administrative examinations: Secondary | ICD-10-CM

## 2017-05-24 DIAGNOSIS — R1312 Dysphagia, oropharyngeal phase: Secondary | ICD-10-CM

## 2017-05-24 DIAGNOSIS — R070 Pain in throat: Secondary | ICD-10-CM | POA: Diagnosis not present

## 2017-05-24 NOTE — Patient Instructions (Signed)

## 2017-05-24 NOTE — Progress Notes (Signed)
Dr. Frederico Hamman T. Ronin Rehfeldt, MD, [facility] [place], [address] Phone: [phone] Fax: [phone]  05/24/2017  Patient: [name], MRN: [id number], DOB: [birth year], 37 y.o.  Primary Physician:  Venia Carbon, MD   Chief Complaint  Patient presents with  . Pain in throat    when he yawns x few months   Subjective:   [name] is a 37 y.o. very pleasant male patient who presents with the following:  Fireman x 20 years. Length of time - Will have some pain on the left side. Question if positional - feels deeper. Feels like in the inside.   Can feel it in the inside. At least a few months - ? If less than a year.  Only on the L side.   No significant ETOH. About a 12 pack a month.  No dip, chew, or smoking. No MJ history, either.  Past Medical History, Surgical History, Social History, Family History, Problem List, Medications, and Allergies have been reviewed and updated if relevant.  Patient Active Problem List   Diagnosis Date Noted  . Vertigo 03/05/2014  . Change in stool habits 01/19/2012  . 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 ALLERGIC RHINITIS 02/28/2007  . HEADACHE, CHRONIC 02/28/2007    Past Medical History:  Diagnosis Date  . Allergic rhinitis   . Narcolepsy   . Spondylolisthesis     Past Surgical History:  Procedure Laterality Date  . VASECTOMY  1/14   Dr Jacqlyn Larsen    Social History   Socioeconomic History  . Marital status: Married    Spouse name: Not on file  . Number of children: 1  . Years of education: Not on file  . Highest education level: Not on file  Occupational History  . Occupation: Airline pilot    Comment: [employer]  . Occupation:    Social Needs  . Financial resource strain: Not on file  . Food insecurity:    Worry: Not on file    Inability: Not on file  . Transportation needs:    Medical: Not on file    Non-medical: Not on file  Tobacco Use   . Smoking status: Never Smoker  . Smokeless tobacco: Never Used  Substance and Sexual Activity  . Alcohol use: Yes    Alcohol/week: 0.0 oz    Comment: occasional  . Drug use: Not on file  . Sexual activity: Not on file  Lifestyle  . Physical activity:    Days per week: Not on file    Minutes per session: Not on file  . Stress: Not on file  Relationships  . Social connections:    Talks on phone: Not on file    Gets together: Not on file    Attends religious service: Not on file    Active member of club or organization: Not on file    Attends meetings of clubs or organizations: Not on file    Relationship status: Not on file  . Intimate partner violence:    Fear of current or ex partner: Not on file    Emotionally abused: Not on file    Physically abused: Not on file    Forced sexual activity: Not on file  Other Topics Concern  . Not on file  Social History Narrative   Married 2nd time   5 step children   1 child    Family History  Problem Relation Age of Onset  .  Diabetes type I Mother   . Hypertension Mother   . Uterine cancer Mother   . Diabetes Mother   . Healthy Father   . Hypertension Paternal Grandmother   . Coronary artery disease Neg Hx   . Cancer Neg Hx        no colon or prostate    No Known Allergies  Medication list reviewed and updated in full in [place].   GEN: No acute illnesses, no fevers, chills. GI: No n/v/d, eating normally Pulm: No SOB Interactive and getting along well at home.  Otherwise, ROS is as per the HPI.  Objective:   BP 100/70   Pulse (!) 56   Temp 98.6 F (37 C) (Oral)   Ht 5' 10.5" (1.791 m)   Wt 205 lb 8 oz (93.2 kg)   BMI 29.07 kg/m   GEN: WDWN, NAD, Non-toxic, A & O x 3 HEENT: Atraumatic, Normocephalic. Neck supple. No masses, No LAD. Ears and Nose: No external deformity. TM clear.  CV: RRR, No M/G/R. No JVD. No thrill. No extra heart sounds. PULM: CTA B, no wheezes, crackles, rhonchi. No  retractions. No resp. distress. No accessory muscle use. EXTR: No c/c/e NEURO Normal gait.  PSYCH: Normally interactive. Conversant. Not depressed or anxious appearing.  Calm demeanor.   Laboratory and Imaging Data:  Assessment and Plan:   Throat pain - Plan: Ambulatory referral to ENT  Oropharyngeal dysphagia - Plan: Ambulatory referral to ENT  Encounter for firefighter health examination - Plan: Ambulatory referral to ENT  Patient does have some concern for persistent abnormal sensation and abnormal sensation with swallowing in his throat for multitude of months.  From where he points, it appears to be more in the upper throat.  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  I think the first thing would have him do his see ENT for their evaluation, possible nasal laryngoscopy.  Appreciate their help.  Follow-up: prn  Orders Placed This Encounter  Procedures  . Ambulatory referral to ENT    Signed,  Frederico Hamman T. Shelbie Franken, MD   Allergies as of 05/24/2017   No Known Allergies     Medication List        Accurate as of 05/24/17  9:54 AM. Always use your most recent med list.          meclizine 25 MG tablet Commonly known as:  ANTIVERT Take 1 tablet (25 mg total) by mouth 3 (three) times daily as needed for dizziness.

## 2017-05-25 DIAGNOSIS — L259 Unspecified contact dermatitis, unspecified cause: Secondary | ICD-10-CM | POA: Diagnosis not present

## 2017-05-25 DIAGNOSIS — K130 Diseases of lips: Secondary | ICD-10-CM | POA: Diagnosis not present

## 2017-05-30 DIAGNOSIS — K130 Diseases of lips: Secondary | ICD-10-CM | POA: Diagnosis not present

## 2017-05-30 DIAGNOSIS — R070 Pain in throat: Secondary | ICD-10-CM | POA: Diagnosis not present

## 2017-05-30 DIAGNOSIS — K219 Gastro-esophageal reflux disease without esophagitis: Secondary | ICD-10-CM | POA: Diagnosis not present

## 2017-05-30 DIAGNOSIS — J301 Allergic rhinitis due to pollen: Secondary | ICD-10-CM | POA: Diagnosis not present

## 2017-12-21 ENCOUNTER — Ambulatory Visit (INDEPENDENT_AMBULATORY_CARE_PROVIDER_SITE_OTHER)
Admission: RE | Admit: 2017-12-21 | Discharge: 2017-12-21 | Disposition: A | Discharge status: Home / Self Care | Payer: 59 | Source: Ambulatory Visit | Attending: Internal Medicine | Admitting: Internal Medicine

## 2017-12-21 ENCOUNTER — Ambulatory Visit: Payer: 59 | Admitting: Internal Medicine

## 2017-12-21 ENCOUNTER — Encounter: Payer: Self-pay | Admitting: Internal Medicine

## 2017-12-21 VITALS — BP 124/84 | HR 60 | Temp 98.7°F | Wt 215.0 lb

## 2017-12-21 DIAGNOSIS — R300 Dysuria: Secondary | ICD-10-CM

## 2017-12-21 DIAGNOSIS — Z23 Encounter for immunization: Secondary | ICD-10-CM | POA: Diagnosis not present

## 2017-12-21 DIAGNOSIS — R310 Gross hematuria: Secondary | ICD-10-CM

## 2017-12-21 DIAGNOSIS — R109 Unspecified abdominal pain: Secondary | ICD-10-CM | POA: Diagnosis not present

## 2017-12-21 LAB — POC URINALSYSI DIPSTICK (AUTOMATED)
Bilirubin, UA: NEGATIVE
Glucose, UA: NEGATIVE
Ketones, UA: NEGATIVE
Leukocytes, UA: NEGATIVE
Nitrite, UA: NEGATIVE
Protein, UA: NEGATIVE
Spec Grav, UA: 1.015 (ref 1.010–1.025)
Urobilinogen, UA: 0.2 E.U./dL
pH, UA: 6.5 (ref 5.0–8.0)

## 2017-12-21 MED ORDER — TAMSULOSIN HCL 0.4 MG PO CAPS: 0.4000 mg | ORAL_CAPSULE | Freq: Every day | ORAL | 0 refills | Status: DC

## 2017-12-21 MED ORDER — CIPROFLOXACIN HCL 500 MG PO TABS: 500.0000 mg | ORAL_TABLET | Freq: Two times a day (BID) | ORAL | 0 refills | Status: DC

## 2017-12-21 NOTE — Patient Instructions (Signed)

## 2017-12-21 NOTE — Progress Notes (Signed)
Subjective:    Patient ID: [name], male    DOB: 1980-03-03, 37 y.o.   MRN: [id number]  HPI  Pt presents to the clinic today with c/o dysuria, blood in his urine and bilateral flank pain. He reports this started 2 week ago. He reports it really is not painful urination, but more of a tingling sensation. He denies urgency, frequency, penile discharge or testicular pain. He denies nausea, vomiting, fever or chills. He has no history of kidney stones. He has had a prostate infection in the past but reports this feels different. He is sexually active but is not concerned about STD's. He has not tried anything OTC for his symptoms.  Review of Systems      Past Medical History:  Diagnosis Date  . Allergic rhinitis   . Narcolepsy   . Spondylolisthesis     Current Outpatient Medications  Medication Sig Dispense Refill  . meclizine (ANTIVERT) 25 MG tablet Take 1 tablet (25 mg total) by mouth 3 (three) times daily as needed for dizziness. 100 tablet 0   No current facility-administered medications for this visit.     No Known Allergies  Family History  Problem Relation Age of Onset  . Diabetes type I Mother   . Hypertension Mother   . Uterine cancer Mother   . Diabetes Mother   . Healthy Father   . Hypertension Paternal Grandmother   . Coronary artery disease Neg Hx   . Cancer Neg Hx        no colon or prostate    Social History   Socioeconomic History  . Marital status: Married    Spouse name: Not on file  . Number of children: 1  . Years of education: Not on file  . Highest education level: Not on file  Occupational History  . Occupation: Airline pilot    Comment: [employer]  . Occupation:    Social Needs  . Financial resource strain: Not on file  . Food insecurity:    Worry: Not on file    Inability: Not on file  . Transportation needs:    Medical: Not on file    Non-medical: Not on file  Tobacco Use  . Smoking status: Never Smoker  . Smokeless tobacco:  Never Used  Substance and Sexual Activity  . Alcohol use: Yes    Alcohol/week: 0.0 standard drinks    Comment: occasional  . Drug use: Not on file  . Sexual activity: Not on file  Lifestyle  . Physical activity:    Days per week: Not on file    Minutes per session: Not on file  . Stress: Not on file  Relationships  . Social connections:    Talks on phone: Not on file    Gets together: Not on file    Attends religious service: Not on file    Active member of club or organization: Not on file    Attends meetings of clubs or organizations: Not on file    Relationship status: Not on file  . Intimate partner violence:    Fear of current or ex partner: Not on file    Emotionally abused: Not on file    Physically abused: Not on file    Forced sexual activity: Not on file  Other Topics Concern  . Not on file  Social History Narrative   Married 2nd time   5 step children   1 child     Constitutional: Denies fever, malaise, fatigue, headache  or abrupt weight changes.  Respiratory: Denies difficulty breathing, shortness of breath, cough or sputum production.   Cardiovascular: Denies chest pain, chest tightness, palpitations or swelling in the hands or feet.  Gastrointestinal: Denies abdominal pain, bloating, constipation, diarrhea or blood in the stool.  GU: Pt reports dysuria, blood in urine and flank pain. Denies urgency, frequency, burning sensation, odor or discharge.  memory, difficulty with speech or problems with balance and coordination.  Psych: Denies anxiety, depression, SI/HI.  No other specific complaints in a complete review of systems (except as listed in HPI above).  Objective:   Physical Exam   BP 124/84   Pulse 60   Temp 98.7 F (37.1 C) (Oral)   Wt 215 lb (97.5 kg)   SpO2 98%   BMI 30.41 kg/m  Wt Readings from Last 3 Encounters:  12/21/17 215 lb (97.5 kg)  05/24/17 205 lb 8 oz (93.2 kg)  03/05/14 216 lb 6.4 oz (98.2 kg)    General: Appears his  stated age, well developed, well nourished in NAD. Cardiovascular: Normal rate and rhythm. S1,S2 noted.  No murmur, rubs or gallops noted.  Pulmonary/Chest: Normal effort and positive vesicular breath sounds. No respiratory distress. No wheezes, rales or ronchi noted.  Abdomen: Soft and nontender. Normal bowel sounds. No distention or masses noted. No CVA tenderness noted. GU: Deferred. Neurological: Alert and oriented.   BMET    Component Value Date/Time   NA 137 09/26/2011 0927   K 3.8 09/26/2011 0927   CL 102 09/26/2011 0927   CO2 25 09/26/2011 0927   GLUCOSE 91 09/26/2011 0927   BUN 12 09/26/2011 0927   CREATININE 0.7 09/26/2011 0927   CALCIUM 9.2 09/26/2011 0927    Lipid Panel  No results found for: CHOL, TRIG, HDL, CHOLHDL, VLDL, LDLCALC  CBC    Component Value Date/Time   WBC 6.2 01/19/2012 0846   RBC 5.23 01/19/2012 0846   HGB 15.1 01/19/2012 0846   HCT 45.1 01/19/2012 0846   PLT 187.0 01/19/2012 0846   MCV 86.2 01/19/2012 0846   MCHC 33.5 01/19/2012 0846   RDW 12.9 01/19/2012 0846   LYMPHSABS 1.6 01/19/2012 0846   MONOABS 0.6 01/19/2012 0846   EOSABS 0.0 01/19/2012 0846   BASOSABS 0.0 01/19/2012 0846    Hgb A1C No results found for: HGBA1C         Assessment & Plan:   Dysuria, Bilateral Flank Pain, Blood in Urine:  Urinalysis: 3+ blood Will send urine culture Concerning for kidney stone, he doesn't want CT Renal Stone study and I agree, he is not in severe pain Will obtain KUB for further evaluation eRx for Flomax 0.4 mg daily x 14 days eRx for Cipro 500 mg BID x 7 days Push fluids  Advised ER if worse over the weekend. Will follow up with xray and lab results. Webb Silversmith, NP

## 2017-12-22 LAB — URINE CULTURE
MICRO NUMBER:: 91348277
Result:: NO GROWTH
SPECIMEN QUALITY:: ADEQUATE

## 2017-12-24 ENCOUNTER — Encounter: Payer: Self-pay | Admitting: Internal Medicine

## 2017-12-24 DIAGNOSIS — R300 Dysuria: Secondary | ICD-10-CM

## 2017-12-24 DIAGNOSIS — R109 Unspecified abdominal pain: Secondary | ICD-10-CM

## 2017-12-24 DIAGNOSIS — R310 Gross hematuria: Secondary | ICD-10-CM

## 2018-01-17 ENCOUNTER — Telehealth: Payer: Self-pay

## 2018-01-17 NOTE — Telephone Encounter (Signed)
Left message for patient to call back to follow up on urology appointment. He has one scheduled with Dr Erlene Quan but last note said to see Dr Jacqlyn Larsen. Need to  Clarify.

## 2018-02-19 ENCOUNTER — Ambulatory Visit: Payer: Self-pay | Admitting: Urology

## 2018-02-26 ENCOUNTER — Encounter

## 2018-02-26 ENCOUNTER — Ambulatory Visit (INDEPENDENT_AMBULATORY_CARE_PROVIDER_SITE_OTHER): Payer: 59 | Admitting: Urology

## 2018-02-26 ENCOUNTER — Encounter: Payer: Self-pay | Admitting: Urology

## 2018-02-26 VITALS — BP 124/75 | HR 66 | Ht 72.0 in | Wt 219.0 lb

## 2018-02-26 DIAGNOSIS — R310 Gross hematuria: Secondary | ICD-10-CM | POA: Diagnosis not present

## 2018-02-26 DIAGNOSIS — R109 Unspecified abdominal pain: Secondary | ICD-10-CM | POA: Diagnosis not present

## 2018-02-26 DIAGNOSIS — R350 Frequency of micturition: Secondary | ICD-10-CM | POA: Diagnosis not present

## 2018-02-26 DIAGNOSIS — N138 Other obstructive and reflux uropathy: Secondary | ICD-10-CM

## 2018-02-26 DIAGNOSIS — Z87438 Personal history of other diseases of male genital organs: Secondary | ICD-10-CM | POA: Diagnosis not present

## 2018-02-26 DIAGNOSIS — N401 Enlarged prostate with lower urinary tract symptoms: Secondary | ICD-10-CM

## 2018-02-26 LAB — URINALYSIS, COMPLETE
Bilirubin, UA: NEGATIVE
Glucose, UA: NEGATIVE
Ketones, UA: NEGATIVE
LEUKOCYTES UA: NEGATIVE
NITRITE UA: NEGATIVE
PH UA: 7 (ref 5.0–7.5)
PROTEIN UA: NEGATIVE
RBC, UA: NEGATIVE
SPEC GRAV UA: 1.01 (ref 1.005–1.030)
Urobilinogen, Ur: 0.2 mg/dL (ref 0.2–1.0)

## 2018-02-26 MED ORDER — TAMSULOSIN HCL 0.4 MG PO CAPS: 0.4000 mg | ORAL_CAPSULE | Freq: Every day | ORAL | 2 refills | Status: DC

## 2018-02-26 NOTE — Progress Notes (Signed)
02/26/2018  3:14 PM   [name] 12/10/1980 [id number]  Referring provider: Venia Carbon, MD [address] [place], [place] [address]  Chief Complaint  Patient presents with  . Flank Pain    New Patient   HPI: [name] is a 38 y.o. male with a history of benign prostatic hypertrophy without urinary tract symptoms and chronic prostatitis. He presents today, with his wife [name], to establish care for his acute flank pain and gross hematuria. He was previously followed by Dr. Edrick Oh at [facility] and was last seen in 2014.  In 12/2017 the patient reports an episode of hematuria (rusty colored) that was the onset of incomplete bladder emptying (dribbling), constant urgency and increased frequency. About two weeks prior to this episode he noticed a tingling sensation at the tip of his penis as well as slight pulsations in his penis every couple seconds during urination. He denies any associated dysuria and burning or stinging pain. He admits to incidences of extreme lower back pain. He has not experienced hematuria since.  The patient was recently seen at [facility] for dysuria, hematuria, and bilateral flank pain on 12/21/2017. His urinalysis at that time revealed 3+ blood (No microscopic analysis). Urine culture resulted without growth. He was prescribed Flomax and Cipro on discharge.  KUB on 12/21/2017 revealed normal bowel gas pattern and no radio-opaque calculi or other significant radiographic abnormality seen.  His baseline stream is relatively weak and he strains to void.  He reports that he also has urinary urgency and frequency, sometimes voids every hour which is bothersome to him.  No particular nighttime symptoms.  This is a lifelong issue but it seemed to progress over the last several years.  He does have a personal history of prostatitis treated many years ago with antibiotics.  He has a paternal history of significant BPH. He has a maternal history of  diabetes.  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   He has had a vasectomy.  PMH: Past Medical History:  Diagnosis Date  . Allergic rhinitis   . Narcolepsy   . Spondylolisthesis    Surgical History: Past Surgical History:  Procedure Laterality Date  . VASECTOMY  1/14   Dr Jacqlyn Larsen   Home Medications:  Allergies as of 02/26/2018   No Known Allergies     Medication List       Accurate as of February 26, 2018  3:14 PM. Always use your most recent med list.        tamsulosin 0.4 MG Caps capsule Commonly known as:  FLOMAX Take 1 capsule (0.4 mg total) by mouth daily.      Allergies: No Known Allergies  Family History: Family History  Problem Relation Age of Onset  . Diabetes type I Mother   . Hypertension Mother   . Uterine cancer Mother   . Diabetes Mother   . Healthy Father   . Hypertension Paternal Grandmother   . Coronary artery disease Neg Hx   . Cancer Neg Hx        no colon or prostate    Social History:  reports that he has never smoked. He has never used smokeless tobacco. He reports current alcohol use. No history on file for drug.  ROS: UROLOGY Frequent Urination?: Yes Hard to postpone urination?: No Burning/pain with urination?: Yes Get up at night to urinate?: No Leakage of urine?: Yes Urine stream starts and stops?: No Trouble  starting stream?: No Do you have to strain to urinate?: No Blood in urine?: Yes Urinary tract infection?: No Sexually transmitted disease?: No Injury to kidneys or bladder?: No Painful intercourse?: No Weak stream?: Yes Erection problems?: No Penile pain?: No  Gastrointestinal Nausea?: No Vomiting?: No Indigestion/heartburn?: No Diarrhea?: Yes Constipation?: No  Constitutional Fever: No Night sweats?: No Weight loss?: No Fatigue?: Yes  Skin Skin rash/lesions?: No Itching?: No  Eyes Blurred vision?: No Double vision?: No  Ears/Nose/Throat Sore  throat?: No Sinus problems?: No  Hematologic/Lymphatic Swollen glands?: No Easy bruising?: No  Cardiovascular Leg swelling?: No Chest pain?: No  Respiratory Cough?: No Shortness of breath?: No  Endocrine Excessive thirst?: No  Musculoskeletal Back pain?: No Joint pain?: No  Neurological Headaches?: No Dizziness?: No  Psychologic Depression?: Yes Anxiety?: Yes  Physical Exam: BP 124/75   Pulse 66   Ht 6' (1.829 m)   Wt 219 lb (99.3 kg)   BMI 29.70 kg/m   Constitutional:  Alert and oriented, No acute distress. Accompanied by wife, [name]. Pleasant couple. Respiratory: Normal respiratory effort, no increased work of breathing. GI: Abdomen is soft, nontender, nondistended, no abdominal masses GU: No CVA tenderness Rectal: Normal sphincter tone. Prostate is 30 grams, no nodules, non tender. Skin: No rashes, bruises or suspicious lesions. Neurologic: Grossly intact, no focal deficits, moving all 4 extremities. Psychiatric: Normal mood and affect.  Laboratory Data: Results for orders placed or performed in visit on 12/21/17  Urine Culture  Result Value Ref Range   MICRO NUMBER: [id number]    SPECIMEN QUALITY: ADEQUATE    Sample Source NOT GIVEN    STATUS: FINAL    Result: No Growth   POCT Urinalysis Dipstick (Automated)  Result Value Ref Range   Color, UA Rust    Clarity, UA Hazy    Glucose, UA Negative Negative   Bilirubin, UA neg    Ketones, UA neg    Spec Grav, UA 1.015 1.010 - 1.025   Blood, UA large    pH, UA 6.5 5.0 - 8.0   Protein, UA Negative Negative   Urobilinogen, UA 0.2 0.2 or 1.0 E.U./dL   Nitrite, UA neg    Leukocytes, UA Negative Negative   Urinalysis today (02/26/2017) was unremarkable.   Pertinent Imaging: Results for orders placed during the hospital encounter of 12/21/17  DG Abd 1 View   Narrative CLINICAL DATA:  Flank pain  EXAM: ABDOMEN - 1 VIEW  COMPARISON:  None.  FINDINGS: The bowel gas pattern is normal. No radio-opaque  calculi or other significant radiographic abnormality are seen.  IMPRESSION: No nephrolithiasis.   Electronically Signed   By: Ulyses Jarred M.D.   On: 12/22/2017 19:54    Assessment & Plan:    1. BPH with obstruction  - He was recently started on Flomax, to continue Flomax             -We will assess prostate at the time of cystoscopy  2. Urinary Frequency             -[place] modification discussed  - Offered anticholinergic/beta-3 agonist and discussed behavorial modification. Patient elected to self treat with behavioral modification.  3. Gross (possible microscopic) hematuria             -Given that the patient believes that he saw gross hematuria, I would recommend pursuing a modified hematuria work-up in the form of cystoscopy and renal ultrasound            -We will hold  off on pursuing CT urogram given the low likelihood of upper tract urothelial cancer in this young patient, and understands this and agrees with this rationale           -Differential diagnosis for blood was discussed including prostatic inflammation injury, malignancy, stone, or false positives such as rhabdomyolysis etc.  4. History of prostatitis  - Previous episode (12/2017) most consistent with prostatitis although differential does include kidney stone amongst others  - Continue Flomax. Advised patient to implement anti-inflammatories (Motrin) if/when symptoms re-occur. Instructed patient to contact me with recurrent symptoms for further evaluation  Return for RUS and cystoscopy.  [facility] [address], [place], [place] [address] [phone]  I, Stephania Fragmin , am acting as a scribe for Hollice Espy, MD  I have reviewed the above documentation for accuracy and completeness, and I agree with the above.   Hollice Espy, MD    [facility] [address] [place], [place] [address] [phone]

## 2018-03-25 ENCOUNTER — Ambulatory Visit
Admission: RE | Admit: 2018-03-25 | Discharge: 2018-03-25 | Disposition: A | Discharge status: Home / Self Care | Payer: 59 | Source: Ambulatory Visit | Attending: Urology | Admitting: Urology

## 2018-03-25 DIAGNOSIS — N281 Cyst of kidney, acquired: Secondary | ICD-10-CM | POA: Diagnosis not present

## 2018-03-25 DIAGNOSIS — R310 Gross hematuria: Secondary | ICD-10-CM

## 2018-03-27 NOTE — Progress Notes (Addendum)
03/28/2018  CC:  Chief Complaint  Patient presents with  . Cysto    HPI: [name] is a 38 yo M who returns today for a cystoscopy for the evaluation and management of gross (possible microscopic) hematuria.   In 12/2017 the patient reports an episode of hematuria (rusty colored) that was the onset of incomplete bladder emptying (dribbling), constant urgency and increased frequency. About two weeks prior to this episode he noticed a tingling sensation at the tip of his penis as well as slight pulsations in his penis every couple seconds during urination. He denies any associated dysuria and burning or stinging pain. He admits to incidences of extreme lower back pain. He has not experienced hematuria since.  The patient was seen at [facility] for dysuria, hematuria, and bilateral flank pain on 12/21/2017. His urinalysis at that time revealed 3+ blood (No microscopic analysis). Urine culture resulted without growth. He was prescribed Flomax and Cipro on discharge.  KUB on 12/21/2017 revealed normal bowel gas pattern and no radio-opaque calculi or other significant radiographic abnormality seen.  RUS from 03/25/2018 shows mild right renal pelvis dilation, small cysts within the kidneys less than 10 on each side, and a 6 x 7 mm possible small stone within bladder.  Vitals:   03/28/18 0852  BP: 123/70  Pulse: 74   NED. A&Ox3.   No respiratory distress   Abd soft, NT, ND Normal phallus with bilateral descended testicles  Cystoscopy Procedure Note  Patient identification was confirmed, informed consent was obtained, and patient was prepped using Betadine solution.  Lidocaine jelly was administered per urethral meatus.     Pre-Procedure: - Inspection reveals a normal caliber ureteral meatus.  Procedure: The flexible cystoscope was introduced without difficulty - No urethral strictures/lesions are present. - Normal prostate  - Normal bladder neck - Bilateral ureteral  orifices identified - Bladder mucosa  reveals no ulcers, tumors, or lesions - No bladder stones - No trabeculation  Retroflexion shows no bladder stones.    Post-Procedure: - Patient tolerated the procedure well  Pertinent Imagings:  CLINICAL DATA:  Gross hematuria  EXAM: RENAL / URINARY TRACT ULTRASOUND COMPLETE  COMPARISON:  None.  FINDINGS: Right Kidney:  Renal measurements: 12.9 x 6.4 x 7.2 cm = volume: 308 mL. Cortical echogenicity is normal. Right renal pelvis dilatation without frank hydronephrosis. Cyst in the upper pole measuring 1.2 cm.  Left Kidney:  Renal measurements: 13.1 x 6.3 x 6.1 cm = volume: 263 mL. Cortical echogenicity is normal. No significant hydronephrosis. Cyst in the midpole measuring 1.3 x 1.6 x 1.1 cm.  Bladder:  Small echogenic focus within the posterior aspect of the bladder measuring 6 x 7 mm.  IMPRESSION: 1. Mild right renal pelvis dilatation without frank hydronephrosis. Negative for hydronephrosis in the left kidney. 2. Small cysts within the kidneys, less than 10 on each side 3. 6 x 7 mm echogenic focus within the posterior bladder, possible small stone   Electronically Signed   By: Donavan Foil M.D.   On: 03/25/2018 15:23  I have personally reviewed the images and agree with radiologist interpretation.   Assessment/ Plan:  1. Flank pain/ penile pain/ history of gross  hematuria - Previous episode (12/2017) most consistent with prostatitis although differential does include kidney stone amongst others - RUS shows mild right renal pelvis dilation, small cysts within the kidneys less than 10 on each side, and a 6 x 7 mm possible small stone in bladder which was not appreciated  on cystoscopy today - Cystoscopy negative for stones  - STAT CT scan today for possible stone given his history of lower back pain and penile pain and RUS finding as it is possible that this represents a retained distal ureteral  stone  Return for schedule for stat CT .   Hollice Espy, MD  I, Lucas Mallow, am acting as a scribe for Dr. Hollice Espy,  I have reviewed the above documentation for accuracy and completeness, and I agree with the above.   Hollice Espy, MD

## 2018-03-28 ENCOUNTER — Ambulatory Visit (INDEPENDENT_AMBULATORY_CARE_PROVIDER_SITE_OTHER): Payer: 59 | Admitting: Urology

## 2018-03-28 ENCOUNTER — Encounter: Payer: Self-pay | Admitting: Urology

## 2018-03-28 ENCOUNTER — Ambulatory Visit
Admission: RE | Admit: 2018-03-28 | Discharge: 2018-03-28 | Disposition: A | Discharge status: Home / Self Care | Payer: 59 | Source: Ambulatory Visit | Attending: Urology | Admitting: Urology

## 2018-03-28 ENCOUNTER — Telehealth: Payer: Self-pay | Admitting: Urology

## 2018-03-28 VITALS — BP 123/70 | HR 74 | Ht 72.0 in | Wt 210.0 lb

## 2018-03-28 DIAGNOSIS — N200 Calculus of kidney: Secondary | ICD-10-CM | POA: Diagnosis not present

## 2018-03-28 DIAGNOSIS — R310 Gross hematuria: Secondary | ICD-10-CM | POA: Diagnosis not present

## 2018-03-28 NOTE — Telephone Encounter (Signed)
Called patient to discuss CT scan results.  He is aware that he is a small punctate stone in the left kidney as well as a probable intramural bladder wall calcification along with additional prostatic calcifications.  We reviewed stone diet in detail.  He may stop by and pick up a stone book.  All questions answered.  Dustin Espy, MD

## 2019-03-14 IMAGING — CT CT RENAL STONE PROTOCOL
2 of 4 series · 16 of 46 positions shown, 18 images · non-contrast
Comparison: 03/25/2010 renal sonogram. 12/21/2017 abdominal
radiograph.

CLINICAL DATA: Gross hematuria. Urinary frequency. Right flank
pain.

EXAM:
CT ABDOMEN AND PELVIS WITHOUT CONTRAST
TECHNIQUE: Multidetector CT imaging of the abdomen and pelvis was performed
following the standard protocol without IV contrast.

[Series 2: stone full standard · axial · 0.75mm/px · z∈[-557,-87]mm · 13 of 104 slices shown, 15 images]
[im 5/104  soft-tissue]
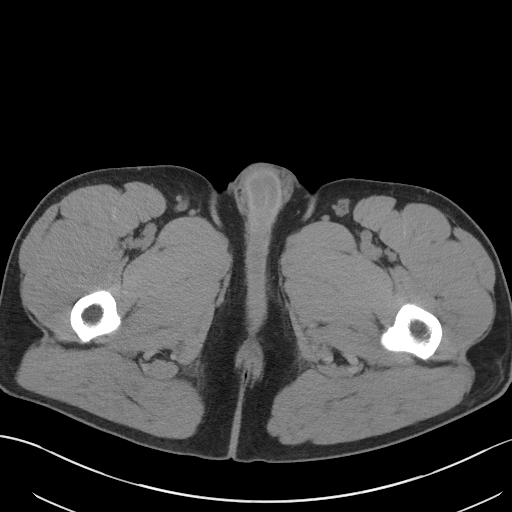
[im 5/104  bone]
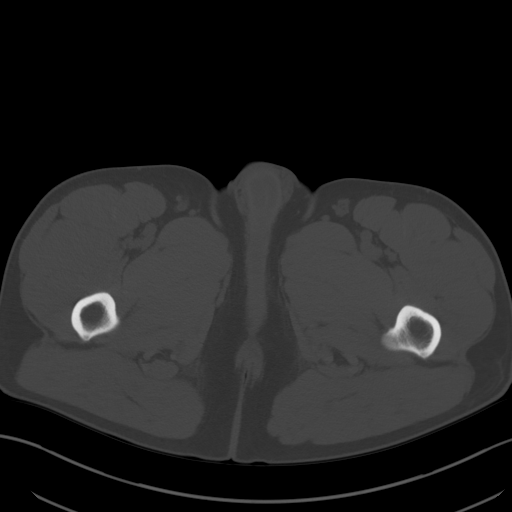
[im 14/104  soft-tissue]
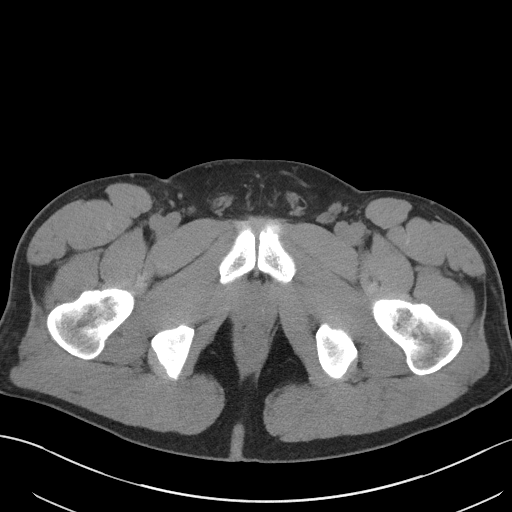
[im 23/104  soft-tissue]
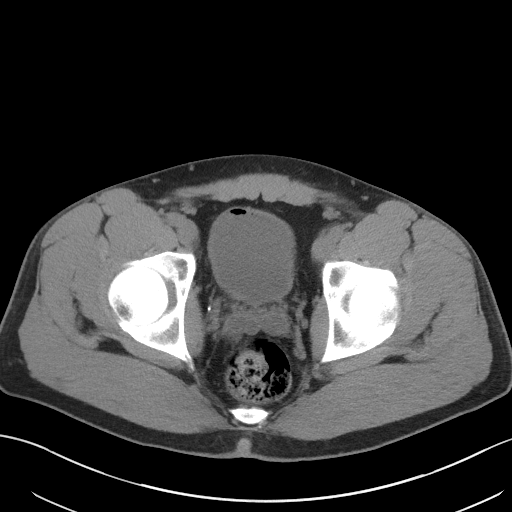
[im 27/104  soft-tissue]
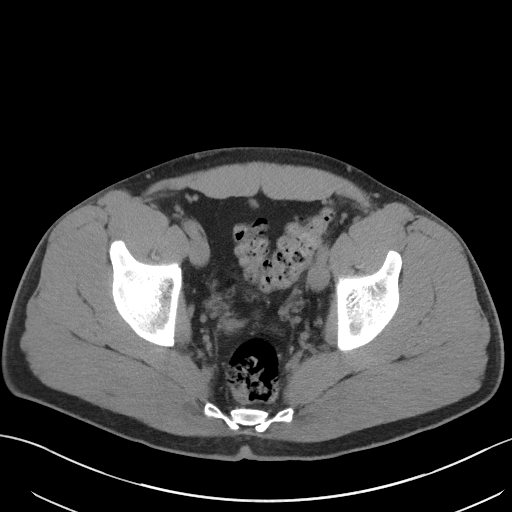
[im 36/104  soft-tissue]
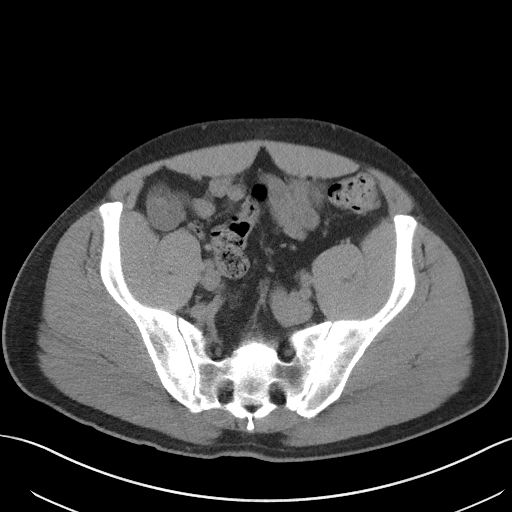
[im 45/104  soft-tissue]
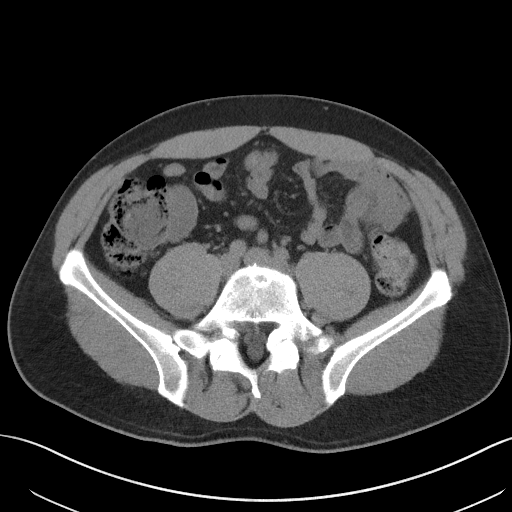
[im 54/104  soft-tissue]
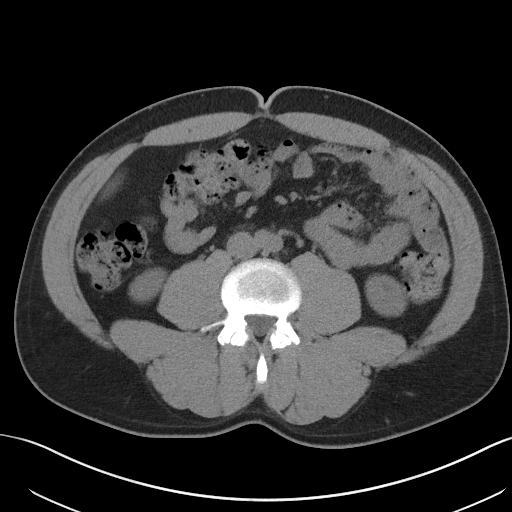
[im 59/104  soft-tissue]
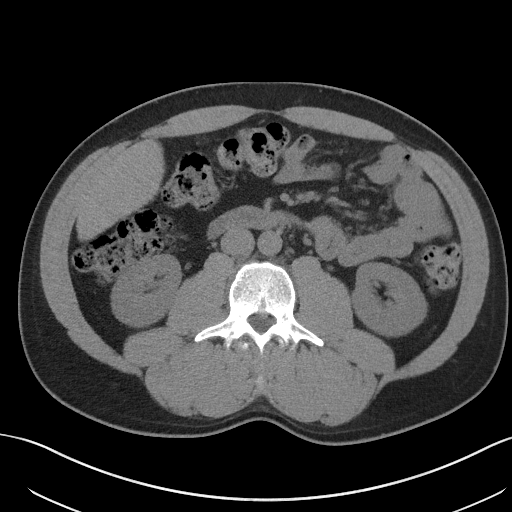
[im 68/104  soft-tissue]
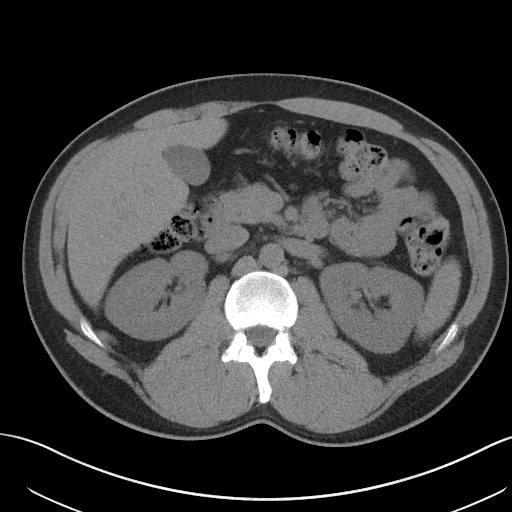
[im 68/104  bone]
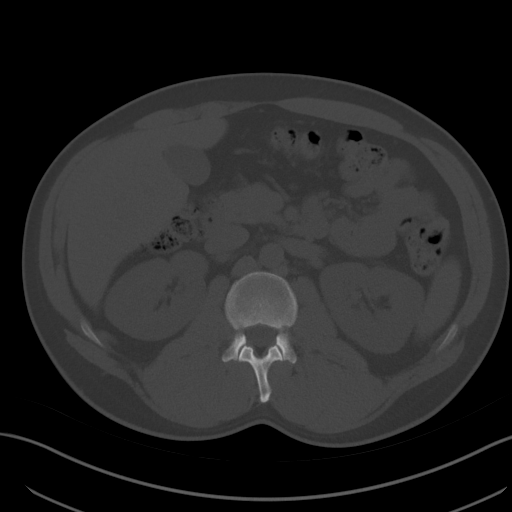
[im 77/104  soft-tissue]
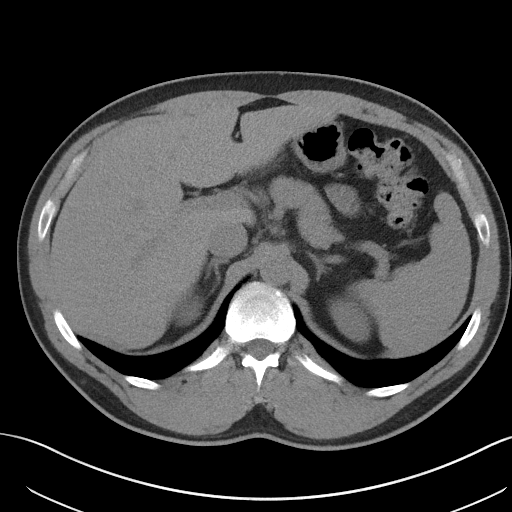
[im 81/104  soft-tissue]
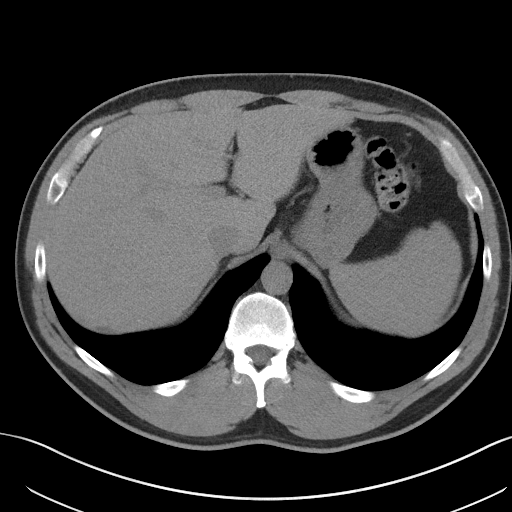
[im 90/104  soft-tissue]
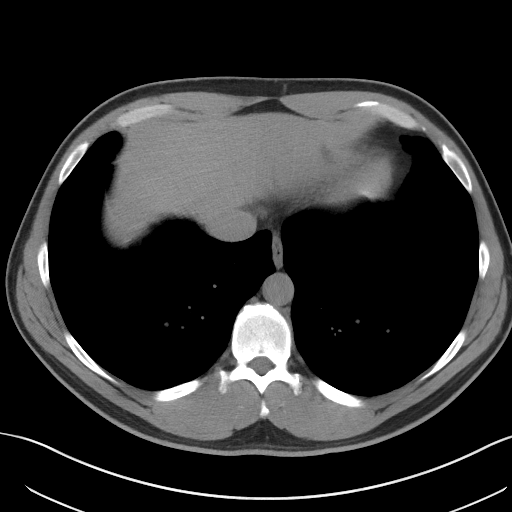
[im 99/104  soft-tissue]
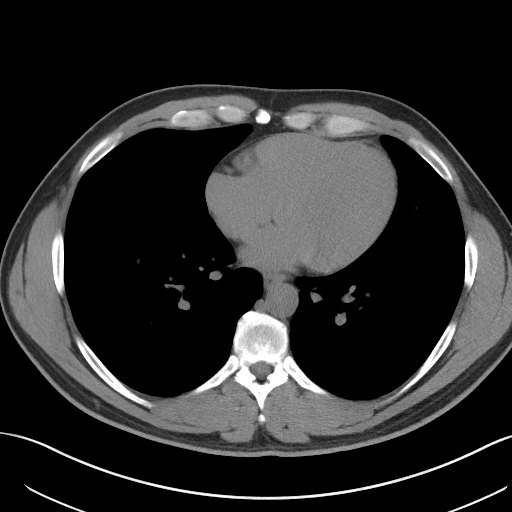

[Series 5: coronal · coronal · 0.86mm/px · 3 of 141 slices shown]
[im 47/141  soft-tissue]
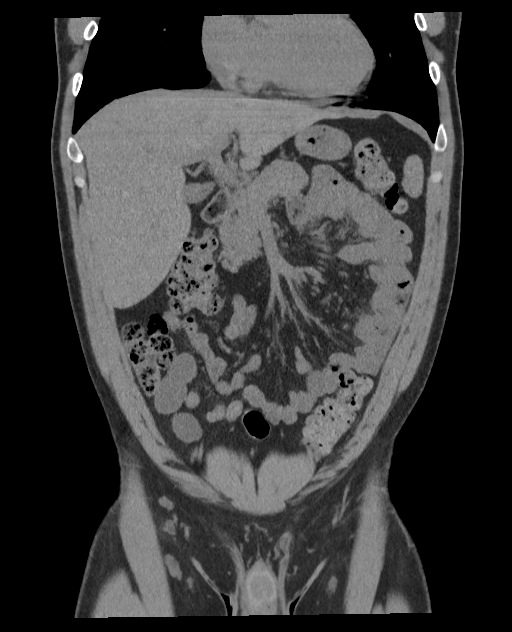
[im 63/141  soft-tissue]
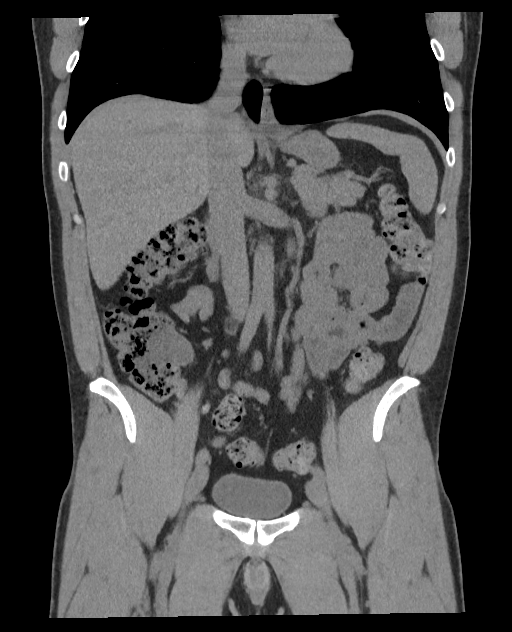
[im 78/141  soft-tissue]
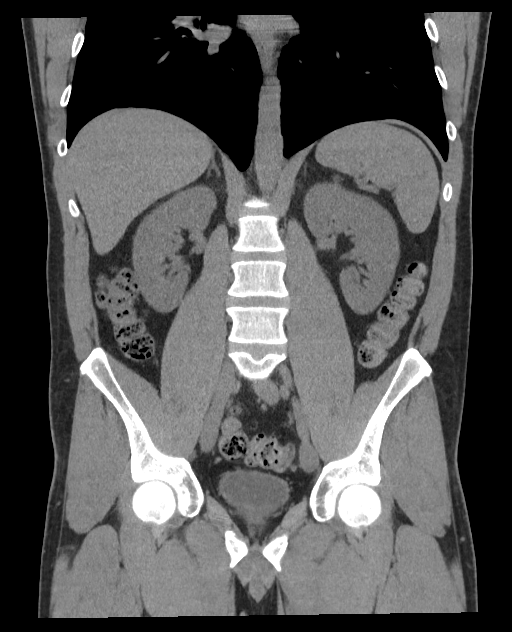

[16 of 46 positions shown; findings below may reference images not displayed]

FINDINGS: Lower chest: No significant pulmonary nodules or acute consolidative
airspace disease.

Hepatobiliary: Normal liver size. No liver mass. Normal gallbladder
with no radiopaque cholelithiasis. No biliary ductal dilatation.

Pancreas: Normal, with no mass or duct dilation.

Spleen: Normal size. No mass.

Adrenals/Urinary Tract: Normal adrenals. No hydronephrosis.
Nonobstructing 2 mm lower left renal stone. No additional renal
stones. No contour deforming renal masses. Normal caliber ureters,
with no ureteral stones. Small amount of gas in the nondependent
bladder. Solitary 3 mm calcification along the posterior right
bladder wall. No definite bladder wall thickening.

Stomach/Bowel: Normal non-distended stomach. Normal caliber small
bowel with no small bowel wall thickening. Normal appendix. Normal
large bowel with no diverticulosis, large bowel wall thickening or
pericolonic fat stranding.

Vascular/Lymphatic: Normal caliber abdominal aorta. No
pathologically enlarged lymph nodes in the abdomen or pelvis.

Reproductive: Top-normal size prostate with nonspecific internal
prostatic calcifications.

Other: No pneumoperitoneum, ascites or focal fluid collection.

Musculoskeletal: No aggressive appearing focal osseous lesions.
Minimal thoracolumbar spondylosis.
IMPRESSION: 1. Nonobstructing 2 mm lower left renal stone. No additional renal
stones. No hydronephrosis. No ureteral stones.
2. Solitary 3 mm calcification along the posterior right bladder
wall, which could represent a layering bladder stone versus
nonspecific mural calcification.
3. Small amount of nonspecific gas in the nondependent bladder.
Correlate for history of any recent bladder instrumentation. If
there is no history of recent bladder instrumentation to explain
this gas, consider correlation with urinalysis to exclude gas
producing cystitis.

## 2019-03-17 ENCOUNTER — Ambulatory Visit (INDEPENDENT_AMBULATORY_CARE_PROVIDER_SITE_OTHER): Payer: 59 | Admitting: Family Medicine

## 2019-03-17 ENCOUNTER — Telehealth: Payer: Self-pay

## 2019-03-17 ENCOUNTER — Encounter: Payer: Self-pay | Admitting: Family Medicine

## 2019-03-17 VITALS — Temp 98.0°F | Ht 72.0 in | Wt 225.0 lb

## 2019-03-17 DIAGNOSIS — U071 COVID-19: Secondary | ICD-10-CM

## 2019-03-17 NOTE — Telephone Encounter (Signed)
Pt left v/m that he tested positive for covid on 03/08/19 at [facility] in [place]; pt had rapid test done. . Pt still having symptoms. I spoke with pt;pt continues to have h/a pain level was 3; pt has generalized weakness, and muscle pain in lower back on both sides when coughs or sneezes; pt has dry cough; pt has had diarrhea since 03/05/19; last diarrhea was 03/16/19.pt has lost sense of smell. Pt has runny nose and S/T. Pt still feels fatigued. Pt continiues qurantine today due to symptoms not being gone. Pt has no rash, abd pain or SOB or fever for 1 wk. Pt scheduled virtual visit with Glenda Chroman FNP today at [facility]. Pt will have T, P and wt ready.

## 2019-03-17 NOTE — Telephone Encounter (Signed)
Noted  

## 2019-03-17 NOTE — Progress Notes (Signed)
Virtual Visit via Video Note  I connected with [name] on 03/17/19 at  3:00 PM EST by a video enabled telemedicine application and verified that I am speaking with the correct person using two identifiers.  Location: Patient: In his home Provider: [facility]  Persons Participating in virtual visit: Patient and provider I discussed the limitations of evaluation and management by telemedicine and the availability of in person appointments. The patient expressed understanding and agreed to proceed.  History of Present Illness: Chief Complaint  Patient presents with  . Covid    (from telephone encounter) Pt tested positive for covid on 03/08/19; pt had rapid test done. Pt still having symptoms. pt continues to have h/a pain level 3/10; pt has generalized weakness, and muscle pain in lower back on both sides when coughs or sneezes; pt has dry cough; pt has had diarrhea since 03/05/19; last diarrhea was 03/16/19.pt has lost sense of smell. Pt has runny nose and S/T. Pt still feels fatigued. 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  He began having nasal congestion on March 06, 2019 that was accompanied by sinus headache.  On January 23 he had increased aches and fatigue and was tested positive for COVID-19.  He continues to have fatigue, muscle aches, headache.  He has little sinus drainage, rare cough, no shortness of breath, no wheeze.  He has lost this sense of smell but this is slowly returning over the last couple of days.  He has been taking acetaminophen alternating with ibuprofen with temporary relief of headache and muscle aches.  He has not been running a fever.  His concern is about returning to work as his job is potentially very physical.   Observations/Objective: Patient is alert and answers questions appropriately.  Visible skin is unremarkable.  Respirations are even and unlabored without increased work of breathing.  No audible wheeze or  witnessed cough.  Mood and affect are appropriate. Temp 98 F (36.7 C) (Oral)   Ht 6' (1.829 m)   Wt 225 lb (102.1 kg)   BMI 30.52 kg/m  Wt Readings from Last 3 Encounters:  03/17/19 225 lb (102.1 kg)  03/28/18 210 lb (95.3 kg)  02/26/18 219 lb (99.3 kg)    Assessment and Plan: 1. COVID-19 virus detected -Discussed typical course of illness.  Reviewed symptomatic treatment measures.  Reviewed return to clinic/ER/UC precautions. -Out of work until March 25, 2019, letter written - [place] - Temperature monitoring; Future   Clarene Reamer, FNP-BC  [facility]  03/17/2019 3:19 PM    Follow Up Instructions:    I discussed the assessment and treatment plan with the patient. The patient was provided an opportunity to ask questions and all were answered. The patient agreed with the plan and demonstrated an understanding of the instructions.   The patient was advised to call back or seek an in-person evaluation if the symptoms worsen or if the condition fails to improve as anticipated.   Elby Beck, FNP

## 2019-03-26 ENCOUNTER — Encounter (INDEPENDENT_AMBULATORY_CARE_PROVIDER_SITE_OTHER): Payer: Self-pay

## 2019-07-07 ENCOUNTER — Ambulatory Visit: Payer: 59 | Admitting: Internal Medicine

## 2020-07-09 ENCOUNTER — Other Ambulatory Visit: Payer: Self-pay

## 2020-07-09 ENCOUNTER — Encounter: Payer: Self-pay | Admitting: Internal Medicine

## 2020-07-09 ENCOUNTER — Ambulatory Visit: Payer: 59 | Admitting: Internal Medicine

## 2020-07-09 DIAGNOSIS — F339 Major depressive disorder, recurrent, unspecified: Secondary | ICD-10-CM

## 2020-07-09 MED ORDER — FLUOXETINE HCL 20 MG PO CAPS: 20.0000 mg | ORAL_CAPSULE | Freq: Every day | ORAL | 3 refills | Status: DC

## 2020-07-09 NOTE — Progress Notes (Signed)
Subjective:    Patient ID: [name], male    DOB: Oct 06, 1980, 40 y.o.   MRN: [id number]  HPI Here due to mood and anger issues This visit occurred during the SARS-CoV-2 public health emergency.  Safety protocols were in place, including screening questions prior to the visit, additional usage of staff PPE, and extensive cleaning of exam room while observing appropriate contact time as indicated for disinfecting solutions.   Years of fighting depression and anger issues Has gotten worse--- and tired of it Depression daily but not all day His anger frightens wife---though never touched or threatened her Considered suicide--but no concrete plan Not really thinking about dying  Does remember mood problems as child (when dad left when he was 1, etc) and in high school Usually triggered by certain events  Working hard---likes it Behind on a lot of stuff, and has employee---some stress  No manic symptoms Some hopeless and helpless feelings --never persistent Does have some affect on cognition for work  Hasn't sought counseling for many years (saw someone briefly after his first wife left)  No current outpatient medications on file prior to visit.   No current facility-administered medications on file prior to visit.    No Known Allergies  Past Medical History:  Diagnosis Date  . Allergic rhinitis   . Narcolepsy   . Spondylolisthesis     Past Surgical History:  Procedure Laterality Date  . VASECTOMY  1/14   Dr Jacqlyn Larsen    Family History  Problem Relation Age of Onset  . Diabetes type I Mother   . Hypertension Mother   . Uterine cancer Mother   . Diabetes Mother   . Healthy Father   . Hypertension Paternal Grandmother   . Coronary artery disease Neg Hx   . Cancer Neg Hx        no colon or prostate    Social History   Socioeconomic History  . Marital status: Married    Spouse name: Not on file  . Number of children: 1  . Years of education: Not on file  .  Highest education level: Not on file  Occupational History  . Occupation: Airline pilot    Comment: [employer]  . Occupation: Grading business on the side  Tobacco Use  . Smoking status: Never Smoker  . Smokeless tobacco: Never Used  Substance and Sexual Activity  . Alcohol use: Yes    Alcohol/week: 0.0 standard drinks    Comment: occasional  . Drug use: Not on file  . Sexual activity: Not on file  Other Topics Concern  . Not on file  Social History Narrative   Married 2nd time   4 step children   1 child   Social Determinants of Radio broadcast assistant Strain: Not on file  Food Insecurity: Not on file  Transportation Needs: Not on file  Physical Activity: Not on file  Stress: Not on file  Social Connections: Not on file  Intimate Partner Violence: Not on file   Review of Systems No sleeping okay Appetite is fine---gaining weight Does have lots of weapons---discussed securing    Objective:   Physical Exam Constitutional:      Appearance: Normal appearance.  Neurological:     Mental Status: He is alert.  Psychiatric:        Mood and Affect: Mood normal.        Behavior: Behavior normal.            Assessment & Plan:

## 2020-07-09 NOTE — Patient Instructions (Signed)
Please start the fluoxetine at 1 capsule every other day for 1 week, then daily if no problems. Let me know if you don't tolerate it (we can change) Access counseling through your work.

## 2020-07-09 NOTE — Assessment & Plan Note (Signed)
Symptoms actually go back to age 40 No mania Anger seems to be part of his picture Discussed options Will try fluoxetine---every other day x 1 week then daily. Discussed black box warning for suicidal ideation Asked him to try to access counseling through job

## 2020-08-11 ENCOUNTER — Encounter: Payer: Self-pay | Admitting: Internal Medicine

## 2020-08-11 ENCOUNTER — Ambulatory Visit: Payer: 59 | Admitting: Internal Medicine

## 2020-08-11 ENCOUNTER — Other Ambulatory Visit: Payer: Self-pay

## 2020-08-11 DIAGNOSIS — F339 Major depressive disorder, recurrent, unspecified: Secondary | ICD-10-CM | POA: Diagnosis not present

## 2020-08-11 NOTE — Patient Instructions (Signed)
If your mood worsens, increase to 40mg  alternating with 20mg  every other day. Let me know if you do this.

## 2020-08-11 NOTE — Assessment & Plan Note (Signed)
May have had a mild response--hard to tell Tolerating well Will continue 20mg  daily If effect not clear---or wanes--will increase to 40/20 Recheck 2 months
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
## 2020-08-23 MED ORDER — FLUOXETINE HCL 40 MG PO CAPS: 40.0000 mg | ORAL_CAPSULE | Freq: Every day | ORAL | 3 refills | Status: DC

## 2020-10-06 MED ORDER — BUPROPION HCL ER (XL) 150 MG PO TB24: 150.0000 mg | ORAL_TABLET | Freq: Every day | ORAL | 1 refills | Status: DC

## 2020-11-03 ENCOUNTER — Ambulatory Visit: Payer: 59 | Admitting: Internal Medicine

## 2020-11-10 ENCOUNTER — Other Ambulatory Visit: Payer: Self-pay

## 2020-11-10 ENCOUNTER — Telehealth (INDEPENDENT_AMBULATORY_CARE_PROVIDER_SITE_OTHER): Payer: 59 | Admitting: Internal Medicine

## 2020-11-10 ENCOUNTER — Encounter: Payer: Self-pay | Admitting: Internal Medicine

## 2020-11-10 DIAGNOSIS — F339 Major depressive disorder, recurrent, unspecified: Secondary | ICD-10-CM

## 2020-11-10 NOTE — Progress Notes (Signed)
Subjective:    Patient ID: [name], male    DOB: [birth year], 40 y.o.   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 30 tablet 1   FLUoxetine (PROZAC) 40 MG capsule Take 1 capsule (40 mg total) by mouth daily. 90 capsule 3   No current facility-administered medications on file prior to visit.    No Known Allergies  Past Medical History:  Diagnosis Date   Allergic rhinitis    Narcolepsy    Spondylolisthesis     Past Surgical History:  Procedure Laterality Date   VASECTOMY  1/14   Dr Jacqlyn Larsen    Family History  Problem Relation Age of Onset   Diabetes type I Mother    Hypertension Mother    Uterine cancer Mother    Diabetes Mother    Healthy Father    Hypertension Paternal Grandmother    Coronary artery disease Neg Hx    Cancer Neg Hx        no colon or prostate    Social History   Socioeconomic History   Marital status: Married    Spouse name: Not on file   Number of children: 1   Years of education: Not on file   Highest education level: Not on file  Occupational History    Occupation: Airline pilot    Comment: [employer]   Occupation: Grading business on the side  Tobacco Use   Smoking status: Never   Smokeless tobacco: Never  Substance and Sexual Activity   Alcohol use: Yes    Alcohol/week: 0.0 standard drinks    Comment: occasional   Drug use: Not on file   Sexual activity: Not on file  Other Topics Concern   Not on file  Social History Narrative   Married 2nd time   4 step children   1 child   Social Determinants of Radio broadcast assistant Strain: Not on file  Food Insecurity: Not on file  Transportation Needs: Not on file  Physical Activity: Not on file  Stress: Not on file  Social Connections: Not on file  Intimate Partner Violence: Not on file   Review of Systems Not sleeping well---no worse on the bupropion Appetite is fine---had been down at first on fluoxetine but back to normal    Objective:   Physical Exam Constitutional:      Appearance: Normal appearance.  Neurological:     Mental Status: He is alert.  Psychiatric:        Mood and Affect: Mood normal.        Behavior: Behavior normal.  Assessment & Plan:

## 2020-11-10 NOTE — Assessment & Plan Note (Signed)
Not clearly better with the bupropion and now having tinnitus which could be from that. Will have him stop and only take fluoxetine.  He is overworked, no vacation, etc He must adjust lifestyle to have time off, vacation, etc

## 2020-11-23 ENCOUNTER — Ambulatory Visit: Payer: 59 | Admitting: Dermatology

## 2020-11-23 ENCOUNTER — Other Ambulatory Visit: Payer: Self-pay

## 2020-11-23 DIAGNOSIS — B353 Tinea pedis: Secondary | ICD-10-CM

## 2020-11-23 DIAGNOSIS — L409 Psoriasis, unspecified: Secondary | ICD-10-CM | POA: Diagnosis not present

## 2020-11-23 DIAGNOSIS — L2089 Other atopic dermatitis: Secondary | ICD-10-CM

## 2020-11-23 MED ORDER — TERBINAFINE HCL 250 MG PO TABS: 250.0000 mg | ORAL_TABLET | Freq: Every day | ORAL | 0 refills | Status: DC

## 2020-11-23 MED ORDER — MOMETASONE FUROATE 0.1 % EX CREA: 1.0000 "application " | TOPICAL_CREAM | CUTANEOUS | 1 refills | Status: DC

## 2020-11-23 NOTE — Progress Notes (Signed)
   Follow-Up Visit   Subjective  [name] is a 40 y.o. male who presents for the following: Rash (Of right leg x 4-5 months that will not heal. Itches a lot and weeps) and Other (Dry patch behind left ear and dryness of feet).  The following portions of the chart were reviewed this encounter and updated as appropriate:   Tobacco  Allergies  Meds  Problems  Med Hx  Surg Hx  Fam Hx     Review of Systems:  No other skin or systemic complaints except as noted in HPI or Assessment and Plan.  Objective  Well appearing patient in no apparent distress; mood and affect are within normal limits.  A focused examination was performed including arms, legs, feet, face, ears. Relevant physical exam findings are noted in the Assessment and Plan.  Right pretibial Scaly pink patch     Bilateral feet Scale        Left Postauricular Area Patch of left postauricular. Slight thickening of skin of knees and elbows      Assessment & Plan  Atopic dermatitis with weeping -persistent and symptomatic Right pretibial  Atopic dermatitis (eczema) is a chronic, relapsing, pruritic condition that can significantly affect quality of life. It is often associated with allergic rhinitis and/or asthma and can require treatment with topical medications, phototherapy, or in severe cases a biologic medication called Dupixent in children and adults.    Start Mometasone cream bid x 2 weeks then decrease to qd to 5 times per week   mometasone (ELOCON) 0.1 % cream - Right pretibial Apply 1 application topically as directed. Bid x 2 weeks then decrease to qd 5 times per week  Tinea pedis of both feet Bilateral feet Chronic and persistent.  Present for over a year.  Symptomatic. Start Terbinafine 250 mg 1 po qd  terbinafine (LAMISIL) 250 MG tablet - Bilateral feet Take 1 tablet (250 mg total) by mouth daily.  Psoriasis Left Postauricular Area; elbows; knees  Psoriasis is a chronic  non-curable, but treatable genetic/hereditary disease that may have other systemic features affecting other organ systems such as joints (Psoriatic Arthritis). It is associated with an increased risk of inflammatory bowel disease, heart disease, non-alcoholic fatty liver disease, and depression.    Start Mometasone cream bid x 2 weeks then decrease to qd 5 times per week  Return in about 6 weeks (around 01/04/2021) for Follow up.  I, Ashok Cordia, CMA, am acting as scribe for Sarina Ser, MD . Documentation: I have reviewed the above documentation for accuracy and completeness, and I agree with the above.  Sarina Ser, MD

## 2020-11-23 NOTE — Patient Instructions (Signed)

## 2020-11-24 ENCOUNTER — Encounter: Payer: Self-pay | Admitting: Dermatology

## 2020-12-06 ENCOUNTER — Telehealth (INDEPENDENT_AMBULATORY_CARE_PROVIDER_SITE_OTHER): Payer: 59 | Admitting: Internal Medicine

## 2020-12-06 ENCOUNTER — Encounter: Payer: Self-pay | Admitting: Internal Medicine

## 2020-12-06 ENCOUNTER — Other Ambulatory Visit: Payer: Self-pay

## 2020-12-06 DIAGNOSIS — F339 Major depressive disorder, recurrent, unspecified: Secondary | ICD-10-CM | POA: Diagnosis not present

## 2020-12-06 DIAGNOSIS — R42 Dizziness and giddiness: Secondary | ICD-10-CM | POA: Diagnosis not present

## 2020-12-06 MED ORDER — ARIPIPRAZOLE 5 MG PO TABS: 5.0000 mg | ORAL_TABLET | Freq: Every day | ORAL | 3 refills | Status: DC

## 2020-12-06 MED ORDER — FLUOXETINE HCL 20 MG PO CAPS: 20.0000 mg | ORAL_CAPSULE | Freq: Every day | ORAL | 3 refills | Status: DC

## 2020-12-06 NOTE — Assessment & Plan Note (Signed)
Rare now--but tinnitus is worse Hearing seems okay Discussed seeing ENT
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
## 2021-01-03 ENCOUNTER — Encounter: Payer: Self-pay | Admitting: Internal Medicine

## 2021-01-03 ENCOUNTER — Ambulatory Visit: Payer: 59 | Admitting: Dermatology

## 2021-01-03 ENCOUNTER — Other Ambulatory Visit: Payer: Self-pay

## 2021-01-03 DIAGNOSIS — L408 Other psoriasis: Secondary | ICD-10-CM | POA: Diagnosis not present

## 2021-01-03 DIAGNOSIS — L409 Psoriasis, unspecified: Secondary | ICD-10-CM

## 2021-01-03 DIAGNOSIS — L918 Other hypertrophic disorders of the skin: Secondary | ICD-10-CM

## 2021-01-03 DIAGNOSIS — B353 Tinea pedis: Secondary | ICD-10-CM | POA: Diagnosis not present

## 2021-01-03 DIAGNOSIS — L209 Atopic dermatitis, unspecified: Secondary | ICD-10-CM | POA: Diagnosis not present

## 2021-01-03 MED ORDER — KETOCONAZOLE 2 % EX SHAM: 1.0000 "application " | MEDICATED_SHAMPOO | CUTANEOUS | 0 refills | Status: DC

## 2021-01-03 MED ORDER — TERBINAFINE HCL 250 MG PO TABS: 250.0000 mg | ORAL_TABLET | Freq: Every day | ORAL | 0 refills | Status: DC

## 2021-01-03 MED ORDER — KETOCONAZOLE 2 % EX SHAM: 1.0000 "application " | MEDICATED_SHAMPOO | CUTANEOUS | 11 refills | Status: DC

## 2021-01-03 NOTE — Patient Instructions (Signed)
Terbinafine Counseling  Terbinafine is an anti-fungal medicine that can be applied to the skin (over the counter) or taken by mouth (prescription) to treat fungal infections. The pill version is often used to treat fungal infections of the nails or scalp. While most people do not have any side effects from taking terbinafine pills, some possible side effects of the medicine can include taste changes, headache, loss of smell, vision changes, nausea, vomiting, or diarrhea.   Rare side effects can include irritation of the liver, allergic reaction, or decrease in blood counts (which may show up as not feeling well or developing an infection). If you are concerned about any of these side effects, please stop the medicine and call your doctor, or in the case of an emergency such as feeling very unwell, seek immediate medical care.   If You Need Anything After Your Visit  If you have any questions or concerns for your doctor, please call our main line at [phone] and press option 4 to reach your doctor's medical assistant. If no one answers, please leave a voicemail as directed and we will return your call as soon as possible. Messages left after 4 pm will be answered the following business day.   You may also send us a message via MyChart. We typically respond to MyChart messages within 1-2 business days.  For prescription refills, please ask your pharmacy to contact our office. Our fax number is [phone].  If you have an urgent issue when the clinic is closed that cannot wait until the next business day, you can page your doctor at the number below.    Please note that while we do our best to be available for urgent issues outside of office hours, we are not available 24/7.   If you have an urgent issue and are unable to reach us, you may choose to seek medical care at your doctor's office, retail clinic, urgent care center, or emergency room.  If you have a medical emergency, please  immediately call 911 or go to the emergency department.  Pager Numbers  - Dr. Kowalski: [phone]  - Dr. Moye: [phone]  - Dr. Stewart: [phone]  In the event of inclement weather, please call our main line at [phone] for an update on the status of any delays or closures.  Dermatology Medication Tips: Please keep the boxes that topical medications come in in order to help keep track of the instructions about where and how to use these. Pharmacies typically print the medication instructions only on the boxes and not directly on the medication tubes.   If your medication is too expensive, please contact our office at [phone] option 4 or send us a message through MyChart.   We are unable to tell what your co-pay for medications will be in advance as this is different depending on your insurance coverage. However, we may be able to find a substitute medication at lower cost or fill out paperwork to get insurance to cover a needed medication.   If a prior authorization is required to get your medication covered by your insurance company, please allow us 1-2 business days to complete this process.  Drug prices often vary depending on where the prescription is filled and some pharmacies may offer cheaper prices.  The website www.goodrx.com contains coupons for medications through different pharmacies. The prices here do not account for what the cost may be with help from insurance (it may be cheaper with your insurance), but the website can give   you the price if you did not use any insurance.  - You can print the associated coupon and take it with your prescription to the pharmacy.  - You may also stop by our office during regular business hours and pick up a GoodRx coupon card.  - If you need your prescription sent electronically to a different pharmacy, notify our office through [facility] MyChart or by phone at [phone] option 4.     Si Usted Necesita Algo Despus  de Su Visita  Tambin puede enviarnos un mensaje a travs de MyChart. Por lo general respondemos a los mensajes de MyChart en el transcurso de 1 a 2 das hbiles.  Para renovar recetas, por favor pida a su farmacia que se ponga en contacto con nuestra oficina. Nuestro nmero de fax es el [phone].  Si tiene un asunto urgente cuando la clnica est cerrada y que no puede esperar hasta el siguiente da hbil, puede llamar/localizar a su doctor(a) al nmero que aparece a continuacin.   Por favor, tenga en cuenta que aunque hacemos todo lo posible para estar disponibles para asuntos urgentes fuera del horario de oficina, no estamos disponibles las 24 horas del da, los 7 das de la semana.   Si tiene un problema urgente y no puede comunicarse con nosotros, puede optar por buscar atencin mdica  en el consultorio de su doctor(a), en una clnica privada, en un centro de atencin urgente o en una sala de emergencias.  Si tiene una emergencia mdica, por favor llame inmediatamente al 911 o vaya a la sala de emergencias.  Nmeros de bper  - Dr. Kowalski: [phone]  - Dra. Moye: [phone]  - Dra. Stewart: [phone]  En caso de inclemencias del tiempo, por favor llame a nuestra lnea principal al [phone] para una actualizacin sobre el estado de cualquier retraso o cierre.  Consejos para la medicacin en dermatologa: Por favor, guarde las cajas en las que vienen los medicamentos de uso tpico para ayudarle a seguir las instrucciones sobre dnde y cmo usarlos. Las farmacias generalmente imprimen las instrucciones del medicamento slo en las cajas y no directamente en los tubos del medicamento.   Si su medicamento es muy caro, por favor, pngase en contacto con nuestra oficina llamando al [phone] y presione la opcin 4 o envenos un mensaje a travs de MyChart.   No podemos decirle cul ser su copago por los medicamentos por adelantado ya que esto es diferente dependiendo  de la cobertura de su seguro. Sin embargo, es posible que podamos encontrar un medicamento sustituto a menor costo o llenar un formulario para que el seguro cubra el medicamento que se considera necesario.   Si se requiere una autorizacin previa para que su compaa de seguros cubra su medicamento, por favor permtanos de 1 a 2 das hbiles para completar este proceso.  Los precios de los medicamentos varan con frecuencia dependiendo del lugar de dnde se surte la receta y alguna farmacias pueden ofrecer precios ms baratos.  El sitio web www.goodrx.com tiene cupones para medicamentos de diferentes farmacias. Los precios aqu no tienen en cuenta lo que podra costar con la ayuda del seguro (puede ser ms barato con su seguro), pero el sitio web puede darle el precio si no utiliz ningn seguro.  - Puede imprimir el cupn correspondiente y llevarlo con su receta a la farmacia.  - Tambin puede pasar por nuestra oficina durante el horario de atencin regular y recoger una tarjeta de cupones de GoodRx.  - Si   necesita que su receta se enve electrnicamente a una farmacia diferente, informe a nuestra oficina a travs de MyChart de [facility] [place] [facility] o por telfono llamando al [phone] y presione la opcin 4.  

## 2021-01-03 NOTE — Progress Notes (Signed)
   Follow-Up Visit   Subjective  [name] is a 40 y.o. male who presents for the following: Follow-up (Patient here for 6 week follow up on tinea pedis. He is currently taking lamisil and reports no side effects. He reports feet have improved ). He also complains of scaly scalp.  His psoriasis is improved on areas he has used the mometasone.   His eczema on his pretibial /leg area is improving but still present. He complains of skin tags.  The following portions of the chart were reviewed this encounter and updated as appropriate:  Tobacco  Allergies  Meds  Problems  Med Hx  Surg Hx  Fam Hx     Review of Systems: No other skin or systemic complaints except as noted in HPI or Assessment and Plan.  Objective  Well appearing patient in no apparent distress; mood and affect are within normal limits.  A focused examination was performed including feet, elbows, legs, knees, arms, and hands . Relevant physical exam findings are noted in the Assessment and Plan.  Right Foot - Anterior Scaling and maceration web spaces and over distal and lateral soles.   Assessment & Plan  Tinea pedis of feet  Severe and requiring systemic oral treatment.   Chronic and persistent and improved but not at goal Restart terbinafine 250 mg by mouth daily   Persistent scaling and maceration web spaces and over distal and lateral soles.  terbinafine (LAMISIL) 250 MG tablet - Right Foot - Anterior Take 1 tablet (250 mg total) by mouth daily.  Psoriasis /SEBO psoriasis Scalp; postauricular areas; elbows; knees Improved but not at goal. Psoriasis is a chronic non-curable, but treatable genetic/hereditary disease that may have other systemic features affecting other organ systems such as joints (Psoriatic Arthritis). It is associated with an increased risk of inflammatory bowel disease, heart disease, non-alcoholic fatty liver disease, and depression.    Start Ketoconazole apply to affected areas of  scalp three times per week, massage into scalp and leave in for 10 minutes before rinsing out Continue mometasone as needed to affected areas of elbows knees and postauricular areas  Related Medications ketoconazole (NIZORAL) 2 % shampoo Apply 1 application topically See admin instructions. apply three times per week, massage into scalp and leave in for 10 minutes before rinsing out  Atopic dermatitis Pretibial areas Improved but not at goal Atopic dermatitis (eczema) is a chronic, relapsing, pruritic condition that can significantly affect quality of life. It is often associated with allergic rhinitis and/or asthma and can require treatment with topical medications, phototherapy, or in severe cases biologic injectable medication (Dupixent; Adbry) or Oral JAK inhibitors.  Continue mometasone cream to affected areas as needed.   Acrochordons (Skin Tags) - Fleshy, skin-colored pedunculated papules neck and ears  - Benign appearing.  - Observe. - If desired, they can be removed with an in office procedure that is not covered by insurance. - Please call the clinic if you notice any new or changing lesions.  Return for 1 year follow up . [name], CMA, am acting as scribe for Sarina Ser, MD. Documentation: I have reviewed the above documentation for accuracy and completeness, and I agree with the above.  Sarina Ser, MD

## 2021-01-12 ENCOUNTER — Other Ambulatory Visit: Payer: Self-pay

## 2021-01-12 ENCOUNTER — Encounter: Payer: Self-pay | Admitting: Internal Medicine

## 2021-01-12 ENCOUNTER — Telehealth (INDEPENDENT_AMBULATORY_CARE_PROVIDER_SITE_OTHER): Payer: 59 | Admitting: Internal Medicine

## 2021-01-12 DIAGNOSIS — F339 Major depressive disorder, recurrent, unspecified: Secondary | ICD-10-CM | POA: Diagnosis not present

## 2021-01-12 NOTE — Progress Notes (Signed)
Subjective:    Patient ID: [name], male    DOB: [birth year], 40 y.o.   MRN: [id number]  HPI Video visit for follow up of chronic depression Identification done Reviewed limitations and billing and he gave consent Participants----patient and wife (she helps with history) at home and I am in my office  Didn't do well with the ablilfy Noticed tremors--pretty bad- and restless feelings Trouble sleeping, weight gain, etc Really noted it worse when he tried a dose in the morning Now off it for 1.5-2 weeks  The tremors are gone Sleep is slightly better---trying to get up earlier and generally feels less tired  He doesn't have sense that the fluoxetine is really helping at all Definitely feels better on the lower dose Wife agrees that it doesn't seem to be helping---it flattens his emotions  No thoughts of dying or suicide  Current Outpatient Medications on File Prior to Visit  Medication Sig Dispense Refill   ARIPiprazole (ABILIFY) 5 MG tablet Take 1 tablet (5 mg total) by mouth at bedtime. 30 tablet 3   FLUoxetine (PROZAC) 20 MG capsule Take 1 capsule (20 mg total) by mouth daily. 30 capsule 3   ketoconazole (NIZORAL) 2 % shampoo Apply 1 application topically See admin instructions. apply three times per week, massage into scalp and leave in for 10 minutes before rinsing out 120 mL 11   mometasone (ELOCON) 0.1 % cream Apply 1 application topically as directed. Bid x 2 weeks then decrease to qd 5 times per week 45 g 1   terbinafine (LAMISIL) 250 MG tablet Take 1 tablet (250 mg total) by mouth daily. 30 tablet 0   No current facility-administered medications on file prior to visit.    No Known Allergies  Past Medical History:  Diagnosis Date   Allergic rhinitis    Narcolepsy    Spondylolisthesis     Past Surgical History:  Procedure Laterality Date   VASECTOMY  1/14   Dr Jacqlyn Larsen    Family History  Problem Relation Age of Onset   Diabetes type I Mother     Hypertension Mother    Uterine cancer Mother    Diabetes Mother    Healthy Father    Hypertension Paternal Grandmother    Coronary artery disease Neg Hx    Cancer Neg Hx        no colon or prostate    Social History   Socioeconomic History   Marital status: Married    Spouse name: Not on file   Number of children: 1   Years of education: Not on file   Highest education level: Not on file  Occupational History   Occupation: Airline pilot    Comment: [employer]   Occupation: Grading business on the side  Tobacco Use   Smoking status: Never   Smokeless tobacco: Never  Substance and Sexual Activity   Alcohol use: Yes    Alcohol/week: 0.0 standard drinks    Comment: occasional   Drug use: Not on file   Sexual activity: Not on file  Other Topics Concern   Not on file  Social History Narrative   Married 2nd time   4 step children   1 child   Social Determinants of Health   Financial Resource Strain: Not on file  Food Insecurity: Not on file  Transportation Needs: Not on file  Physical Activity: Not on file  Stress: Not on file  Social Connections: Not on file  Intimate Partner Violence: Not on file  Review of Systems Appetite down again some     Objective:   Physical Exam Constitutional:      Appearance: Normal appearance.  Neurological:     Mental Status: He is alert.  Psychiatric:     Comments: Calm now No clear evident depression at this point           Assessment & Plan:

## 2021-01-12 NOTE — Assessment & Plan Note (Signed)
He did poorly with abilify Never did that great with fluoxetine---wife notes still anger, etc Discussed options---he would like to try off meds again Knows he has triggers---like his middle child---and thinks he can work through his stress better  If things worsen, will try duloxetine

## 2021-01-16 ENCOUNTER — Encounter: Payer: Self-pay | Admitting: Dermatology

## 2021-07-06 ENCOUNTER — Telehealth: Payer: 59 | Admitting: Nurse Practitioner

## 2021-07-06 ENCOUNTER — Encounter: Payer: Self-pay | Admitting: Internal Medicine

## 2021-07-06 DIAGNOSIS — N410 Acute prostatitis: Secondary | ICD-10-CM | POA: Diagnosis not present

## 2021-07-06 MED ORDER — DOXYCYCLINE HYCLATE 100 MG PO TABS: 100.0000 mg | ORAL_TABLET | Freq: Two times a day (BID) | ORAL | 0 refills | Status: DC

## 2021-07-06 MED ORDER — TAMSULOSIN HCL 0.4 MG PO CAPS: 0.4000 mg | ORAL_CAPSULE | Freq: Every day | ORAL | 3 refills | Status: AC

## 2021-07-06 NOTE — Telephone Encounter (Signed)
Spoke to pt. Advised him that he would need to be seen to get an antibiotic. He states he is busy the next few days to make an OV. I gave him information on how to schedule a VV on MyChart. He will do that.

## 2021-07-06 NOTE — Progress Notes (Signed)
Virtual Visit Consent   [name], you are scheduled for a virtual visit with Mary-Margaret Hassell Done, [place], a [facility] provider, today.     Just as with appointments in the office, your consent must be obtained to participate.  Your consent will be active for this visit and any virtual visit you may have with one of our providers in the next 365 days.     If you have a MyChart account, a copy of this consent can be sent to you electronically.  All virtual visits are billed to your insurance company just like a traditional visit in the office.    As this is a virtual visit, video technology does not allow for your provider to perform a traditional examination.  This may limit your provider's ability to fully assess your condition.  If your provider identifies any concerns that need to be evaluated in person or the need to arrange testing (such as labs, EKG, etc.), we will make arrangements to do so.     Although advances in technology are sophisticated, we cannot ensure that it will always work on either your end or our end.  If the connection with a video visit is poor, the visit may have to be switched to a telephone visit.  With either a video or telephone visit, we are not always able to ensure that we have a secure connection.     I need to obtain your verbal consent now.   Are you willing to proceed with your visit today? YES   [name] has provided verbal consent on 07/06/2021 for a virtual visit (video or telephone).   Mary-Margaret Hassell Done, FNP   Date: 07/06/2021 11:08 AM   Virtual Visit via Video Note   I, Mary-Margaret Hassell Done, connected with [name] ([id number], 1980/05/04) on 07/06/21 at 11:15 AM EDT by a video-enabled telemedicine application and verified that I am speaking with the correct person using two identifiers.  Location: Patient: Virtual Visit Location Patient: Home Provider: Virtual Visit Location Provider: Mobile   I discussed the limitations of  evaluation and management by telemedicine and the availability of in person appointments. The patient expressed understanding and agreed to proceed.    History of Present Illness: [name] is a 41 y.o. who identifies as a male who was assigned male at birth, and is being seen today for prostate issue.  HPI: Patient alls stating  that  he is having a reoccurent issue with his prostate. He has had prostatitis inth epast. Has been several years ago. Started having problems with stream and slight burning , which is the same issues he has had in the past. He used combination of antibiotic and flomax in the past which worked well for him.   Review of Systems  Constitutional:  Negative for chills, fever and malaise/fatigue.  Gastrointestinal: Negative.   Genitourinary:  Positive for frequency. Negative for dysuria, flank pain and urgency.   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 apply three times per week, massage into scalp and leave in for 10 minutes before rinsing out, Disp: 120 mL, Rfl: 11   mometasone (ELOCON) 0.1 % cream, Apply 1 application topically as directed. Bid x 2 weeks then decrease to qd 5 times per week, Disp: 45 g, Rfl: 1   terbinafine (LAMISIL) 250 MG tablet, Take 1 tablet (250 mg total) by mouth daily., Disp: 30 tablet, Rfl: 0  Observations/Objective: Patient is well-developed, well-nourished in no acute distress.   Resting comfortably  at home.  Head is normocephalic, atraumatic.  No labored breathing. Speech is clear and coherent with logical content.  Patient is alert and oriented at baseline.    Assessment and Plan:  [name] in today with chief complaint of No chief complaint on file.   1. Acute prostatitis Force fluids See urologist if is not clearing in a few days. Meds ordered this encounter  Medications   doxycycline (VIBRA-TABS) 100 MG tablet    Sig: Take 1 tablet (100 mg total) by mouth 2 (two) times daily. 1 po bid    Dispense:  28 tablet    Refill:  0    Order Specific Question:   Supervising Provider    Answer:   Sabra Heck, BRIAN [[id number]]   tamsulosin (FLOMAX) 0.4 MG CAPS capsule    Sig: Take 1 capsule (0.4 mg total) by mouth daily.    Dispense:  30 capsule    Refill:  3    Order Specific Question:   Supervising Provider    Answer:   Noemi Chapel [[id number]]      Follow Up Instructions: I discussed the assessment and treatment plan with the patient. The patient was provided an opportunity to ask questions and all were answered. The patient agreed with the plan and demonstrated an understanding of the instructions.  A copy of instructions were sent to the patient via MyChart.  The patient was advised to call back or seek an in-person evaluation if the symptoms worsen or if the condition fails to improve as anticipated.  Time:  I spent 9 minutes with the patient via telehealth technology discussing the above problems/concerns.    Mary-Margaret Hassell Done, FNP

## 2021-07-06 NOTE — Patient Instructions (Signed)

## 2022-01-09 ENCOUNTER — Ambulatory Visit: Payer: 59 | Admitting: Dermatology

## 2022-01-11 ENCOUNTER — Ambulatory Visit: Payer: 59 | Admitting: Dermatology

## 2022-01-11 VITALS — BP 129/93 | HR 75

## 2022-01-11 DIAGNOSIS — D229 Melanocytic nevi, unspecified: Secondary | ICD-10-CM

## 2022-01-11 DIAGNOSIS — L304 Erythema intertrigo: Secondary | ICD-10-CM

## 2022-01-11 DIAGNOSIS — L409 Psoriasis, unspecified: Secondary | ICD-10-CM | POA: Diagnosis not present

## 2022-01-11 DIAGNOSIS — Z79899 Other long term (current) drug therapy: Secondary | ICD-10-CM

## 2022-01-11 DIAGNOSIS — Z1283 Encounter for screening for malignant neoplasm of skin: Secondary | ICD-10-CM | POA: Diagnosis not present

## 2022-01-11 DIAGNOSIS — L578 Other skin changes due to chronic exposure to nonionizing radiation: Secondary | ICD-10-CM

## 2022-01-11 DIAGNOSIS — L821 Other seborrheic keratosis: Secondary | ICD-10-CM

## 2022-01-11 DIAGNOSIS — L814 Other melanin hyperpigmentation: Secondary | ICD-10-CM

## 2022-01-11 DIAGNOSIS — L209 Atopic dermatitis, unspecified: Secondary | ICD-10-CM

## 2022-01-11 MED ORDER — KETOCONAZOLE 2 % EX SHAM: 1.0000 | MEDICATED_SHAMPOO | CUTANEOUS | 11 refills | Status: AC

## 2022-01-11 MED ORDER — ZORYVE 0.3 % EX CREA: 1.0000 "application " | TOPICAL_CREAM | Freq: Every day | CUTANEOUS | 6 refills | Status: AC

## 2022-01-11 NOTE — Progress Notes (Signed)
Follow-Up Visit   Subjective  [name] is a 41 y.o. male who presents for the following: Annual Exam (No history of skin cancer or abnormal moles - The patient presents for Total-Body Skin Exam (TBSE) for skin cancer screening and mole check.  The patient has spots, moles and lesions to be evaluated, some may be new or changing and the patient has concerns that these could be cancer./).  The following portions of the chart were reviewed this encounter and updated as appropriate:   Tobacco  Allergies  Meds  Problems  Med Hx  Surg Hx  Fam Hx     Review of Systems:  No other skin or systemic complaints except as noted in HPI or Assessment and Plan.  Objective  Well appearing patient in no apparent distress; mood and affect are within normal limits.  A full examination was performed including scalp, head, eyes, ears, nose, lips, neck, chest, axillae, abdomen, back, buttocks, bilateral upper extremities, bilateral lower extremities, hands, feet, fingers, toes, fingernails, and toenails. All findings within normal limits unless otherwise noted below.  10.0 x 0.6 cm plaque of right pretibial. Scale of postauricular areas.  Groin area Pinkness   Assessment & Plan   Keratosis Pilaris - Tiny follicular keratotic papules - Benign. Genetic in nature. No cure. - Observe. - If desired, patient can use an emollient (moisturizer) containing ammonium lactate, urea or salicylic acid once a day to smooth the area  Lentigines - Scattered tan macules - Due to sun exposure - Benign-appearing, observe - Recommend daily broad spectrum sunscreen SPF 30+ to sun-exposed areas, reapply every 2 hours as needed. - Call for any changes  Seborrheic Keratoses - Stuck-on, waxy, tan-brown papules and/or plaques  - Benign-appearing - Discussed benign etiology and prognosis. - Observe - Call for any changes  Melanocytic Nevi - Tan-brown and/or pink-flesh-colored symmetric macules and  papules - Benign appearing on exam today - Observation - Call clinic for new or changing moles - Recommend daily use of broad spectrum spf 30+ sunscreen to sun-exposed areas.   Hemangiomas - Red papules - Discussed benign nature - Observe - Call for any changes  Actinic Damage - Chronic condition, secondary to cumulative UV/sun exposure - diffuse scaly erythematous macules with underlying dyspigmentation - Recommend daily broad spectrum sunscreen SPF 30+ to sun-exposed areas, reapply every 2 hours as needed.  - Staying in the shade or wearing long sleeves, sun glasses (UVA+UVB protection) and wide brim hats (4-inch brim around the entire circumference of the hat) are also recommended for sun protection.  - Call for new or changing lesions.  Skin cancer screening performed today.  Psoriasis Psoriasis/Sebopsoriasis with atopic dermatitis overlap Start Zoryve cream qhs Continue Ketoconazole 2% shampoo - recommend alternating with Head and Shoulders shampoo  Roflumilast (ZORYVE) 0.3 % CREA Apply 1 application  topically at bedtime. ketoconazole (NIZORAL) 2 % shampoo Apply 1 Application topically See admin instructions. apply three times per week, massage into scalp and leave in for 10 minutes before rinsing out  Erythema intertrigo Groin area Intertrigo is a chronic recurrent rash that occurs in skin fold areas that may be associated with friction; heat; moisture; yeast; fungus; and bacteria.  It is exacerbated by increased movement / activity; sweating; and higher atmospheric temperature.  Start Ketoconazole 2% shampoo - patient has and will wash area every time he shampoos his scalp prn  Return in about 6 months (around 07/12/2022).  I, Ashok Cordia, CMA, am acting as scribe for Sarina Ser,  MD . Documentation: I have reviewed the above documentation for accuracy and completeness, and I agree with the above.  Sarina Ser, MD

## 2022-01-11 NOTE — Patient Instructions (Addendum)
Your prescription was sent to [facility] in [place]. A representative from [facility] will contact you within 2 business hours to verify your address and insurance information to schedule a free delivery. If for any reason you do not receive a phone call from them, please reach out to them. Their phone number is [phone] and their hours are Monday-Friday 9:00 am-5:00 pm.      Due to recent changes in healthcare laws, you may see results of your pathology and/or laboratory studies on MyChart before the doctors have had a chance to review them. We understand that in some cases there may be results that are confusing or concerning to you. Please understand that not all results are received at the same time and often the doctors may need to interpret multiple results in order to provide you with the best plan of care or course of treatment. Therefore, we ask that you please give us 2 business days to thoroughly review all your results before contacting the office for clarification. Should we see a critical lab result, you will be contacted sooner.   If You Need Anything After Your Visit  If you have any questions or concerns for your doctor, please call our main line at [phone] and press option 4 to reach your doctor's medical assistant. If no one answers, please leave a voicemail as directed and we will return your call as soon as possible. Messages left after 4 pm will be answered the following business day.   You may also send us a message via MyChart. We typically respond to MyChart messages within 1-2 business days.  For prescription refills, please ask your pharmacy to contact our office. Our fax number is [phone].  If you have an urgent issue when the clinic is closed that cannot wait until the next business day, you can page your doctor at the number below.    Please note that while we do our best to be available for urgent issues outside of office hours, we are not  available 24/7.   If you have an urgent issue and are unable to reach us, you may choose to seek medical care at your doctor's office, retail clinic, urgent care center, or emergency room.  If you have a medical emergency, please immediately call 911 or go to the emergency department.  Pager Numbers  - Dr. Kowalski: [phone]  - Dr. Moye: [phone]  - Dr. Stewart: [phone]  In the event of inclement weather, please call our main line at [phone] for an update on the status of any delays or closures.  Dermatology Medication Tips: Please keep the boxes that topical medications come in in order to help keep track of the instructions about where and how to use these. Pharmacies typically print the medication instructions only on the boxes and not directly on the medication tubes.   If your medication is too expensive, please contact our office at [phone] option 4 or send us a message through MyChart.   We are unable to tell what your co-pay for medications will be in advance as this is different depending on your insurance coverage. However, we may be able to find a substitute medication at lower cost or fill out paperwork to get insurance to cover a needed medication.   If a prior authorization is required to get your medication covered by your insurance company, please allow us 1-2 business days to complete this process.  Drug prices often vary depending on where the prescription is   filled and some pharmacies may offer cheaper prices.  The website www.goodrx.com contains coupons for medications through different pharmacies. The prices here do not account for what the cost may be with help from insurance (it may be cheaper with your insurance), but the website can give you the price if you did not use any insurance.  - You can print the associated coupon and take it with your prescription to the pharmacy.  - You may also stop by our office during regular business hours  and pick up a GoodRx coupon card.  - If you need your prescription sent electronically to a different pharmacy, notify our office through [facility] MyChart or by phone at [phone] option 4.     Si Usted Necesita Algo Despus de Su Visita  Tambin puede enviarnos un mensaje a travs de MyChart. Por lo general respondemos a los mensajes de MyChart en el transcurso de 1 a 2 das hbiles.  Para renovar recetas, por favor pida a su farmacia que se ponga en contacto con nuestra oficina. Nuestro nmero de fax es el [phone].  Si tiene un asunto urgente cuando la clnica est cerrada y que no puede esperar hasta el siguiente da hbil, puede llamar/localizar a su doctor(a) al nmero que aparece a continuacin.   Por favor, tenga en cuenta que aunque hacemos todo lo posible para estar disponibles para asuntos urgentes fuera del horario de oficina, no estamos disponibles las 24 horas del da, los 7 das de la semana.   Si tiene un problema urgente y no puede comunicarse con nosotros, puede optar por buscar atencin mdica  en el consultorio de su doctor(a), en una clnica privada, en un centro de atencin urgente o en una sala de emergencias.  Si tiene una emergencia mdica, por favor llame inmediatamente al 911 o vaya a la sala de emergencias.  Nmeros de bper  - Dr. Kowalski: [phone]  - Dra. Moye: [phone]  - Dra. Stewart: [phone]  En caso de inclemencias del tiempo, por favor llame a nuestra lnea principal al [phone] para una actualizacin sobre el estado de cualquier retraso o cierre.  Consejos para la medicacin en dermatologa: Por favor, guarde las cajas en las que vienen los medicamentos de uso tpico para ayudarle a seguir las instrucciones sobre dnde y cmo usarlos. Las farmacias generalmente imprimen las instrucciones del medicamento slo en las cajas y no directamente en los tubos del medicamento.   Si su medicamento es muy caro, por favor, pngase  en contacto con nuestra oficina llamando al [phone] y presione la opcin 4 o envenos un mensaje a travs de MyChart.   No podemos decirle cul ser su copago por los medicamentos por adelantado ya que esto es diferente dependiendo de la cobertura de su seguro. Sin embargo, es posible que podamos encontrar un medicamento sustituto a menor costo o llenar un formulario para que el seguro cubra el medicamento que se considera necesario.   Si se requiere una autorizacin previa para que su compaa de seguros cubra su medicamento, por favor permtanos de 1 a 2 das hbiles para completar este proceso.  Los precios de los medicamentos varan con frecuencia dependiendo del lugar de dnde se surte la receta y alguna farmacias pueden ofrecer precios ms baratos.  El sitio web www.goodrx.com tiene cupones para medicamentos de diferentes farmacias. Los precios aqu no tienen en cuenta lo que podra costar con la ayuda del seguro (puede ser ms barato con su seguro), pero el sitio web puede darle   el precio si no utiliz ningn seguro.  - Puede imprimir el cupn correspondiente y llevarlo con su receta a la farmacia.  - Tambin puede pasar por nuestra oficina durante el horario de atencin regular y recoger una tarjeta de cupones de GoodRx.  - Si necesita que su receta se enve electrnicamente a una farmacia diferente, informe a nuestra oficina a travs de MyChart de [place] o por telfono llamando al [phone] y presione la opcin 4.  

## 2022-01-22 ENCOUNTER — Encounter: Payer: Self-pay | Admitting: Dermatology

## 2022-01-30 ENCOUNTER — Other Ambulatory Visit: Payer: Self-pay

## 2022-01-30 ENCOUNTER — Emergency Department
Admission: EM | Admit: 2022-01-30 | Discharge: 2022-01-30 | Disposition: A | Discharge status: Home / Self Care | Payer: 59 | Attending: Emergency Medicine | Admitting: Emergency Medicine

## 2022-01-30 ENCOUNTER — Encounter: Payer: Self-pay | Admitting: Emergency Medicine

## 2022-01-30 ENCOUNTER — Telehealth: Payer: Self-pay | Admitting: Internal Medicine

## 2022-01-30 DIAGNOSIS — I10 Essential (primary) hypertension: Secondary | ICD-10-CM | POA: Diagnosis present

## 2022-01-30 LAB — CBC WITH DIFFERENTIAL/PLATELET
Abs Immature Granulocytes: 0.1 10*3/uL — ABNORMAL HIGH (ref 0.00–0.07)
Basophils Absolute: 0.1 10*3/uL (ref 0.0–0.1)
Basophils Relative: 1 %
Eosinophils Absolute: 0.1 10*3/uL (ref 0.0–0.5)
Eosinophils Relative: 1 %
HCT: 46 % (ref 39.0–52.0)
Hemoglobin: 15.5 g/dL (ref 13.0–17.0)
Immature Granulocytes: 1 %
Lymphocytes Relative: 22 %
Lymphs Abs: 2 10*3/uL (ref 0.7–4.0)
MCH: 28.8 pg (ref 26.0–34.0)
MCHC: 33.7 g/dL (ref 30.0–36.0)
MCV: 85.5 fL (ref 80.0–100.0)
Monocytes Absolute: 0.8 10*3/uL (ref 0.1–1.0)
Monocytes Relative: 9 %
Neutro Abs: 6.1 10*3/uL (ref 1.7–7.7)
Neutrophils Relative %: 66 %
Platelets: 224 10*3/uL (ref 150–400)
RBC: 5.38 MIL/uL (ref 4.22–5.81)
RDW: 12.2 % (ref 11.5–15.5)
WBC: 9.1 10*3/uL (ref 4.0–10.5)
nRBC: 0 % (ref 0.0–0.2)

## 2022-01-30 LAB — TROPONIN I (HIGH SENSITIVITY): Troponin I (High Sensitivity): 2 ng/L (ref <18)

## 2022-01-30 LAB — BASIC METABOLIC PANEL
Anion gap: 7 (ref 5–15)
BUN: 14 mg/dL (ref 6–20)
CO2: 26 mmol/L (ref 22–32)
Calcium: 9.4 mg/dL (ref 8.9–10.3)
Chloride: 105 mmol/L (ref 98–111)
Creatinine, Ser: 0.97 mg/dL (ref 0.61–1.24)
GFR, Estimated: >60 mL/min (ref >60)
Glucose, Bld: 96 mg/dL (ref 70–99)
Potassium: 4.1 mmol/L (ref 3.5–5.1)
Sodium: 138 mmol/L (ref 135–145)

## 2022-01-30 MED ORDER — CLONIDINE HCL 0.1 MG PO TABS: 0.1000 mg | ORAL_TABLET | Freq: Every day | ORAL | 0 refills | Status: AC | PRN

## 2022-01-30 NOTE — Telephone Encounter (Signed)
Patient wife called and stated patient blood pressure readings been ranging from 158/98 to 162/102 and he is experiencing foggy headiness. Patient was sent to access nurse.

## 2022-01-30 NOTE — Telephone Encounter (Signed)
Per chart review tab pt is at [facility] ED now. Sending note to Dutch Quint FNP and [place] pool.

## 2022-01-30 NOTE — ED Provider Notes (Signed)
[facility] Provider Note    Event Date/Time   First MD Initiated Contact with Patient 01/30/22 1628     (approximate)   History   Hypertension (/)   HPI  [name] is a 41 y.o. male with history of vertigo who presents to the emergency department for treatment and evaluation of hypertension. He has been checking his BP twice per day. Pressures have been 160's/upper 90s to over 100. Typically asymptomatic, but today felt "foggy" and hands were shaking. He does notice midsternal chest tightness occasionally, but has considered it to be musculoskeletal. Same chest tightness today. No palpitations, sensation of rapid heart rate, or near syncope.       Physical Exam   Triage Vital Signs: ED Triage Vitals  Enc Vitals Group     BP 01/30/22 1627 (!) 158/104     Pulse --      Resp 01/30/22 1628 18     Temp 01/30/22 1628 98.8 F (37.1 C)     Temp src --      SpO2 01/30/22 1627 98 %     Weight 01/30/22 1623 218 lb 0.6 oz (98.9 kg)     Height 01/30/22 1623 6' (1.829 m)     Head Circumference --      Peak Flow --      Pain Score 01/30/22 1628 4     Pain Loc --      Pain Edu? --      Excl. in [place]? --     Most recent vital signs: Vitals:   01/30/22 1628 01/30/22 1753  BP:  137/84  Pulse: 83 80  Resp: 18 18  Temp: 98.8 F (37.1 C)   SpO2: 94% 95%     General: Awake, no distress.  CV:  Good peripheral perfusion.  Resp:  Normal effort.  Abd:  No distention.  Other:     ED Results / Procedures / Treatments   Labs (all labs ordered are listed, but only abnormal results are displayed) Labs Reviewed  CBC WITH DIFFERENTIAL/PLATELET - Abnormal; Notable for the following components:      Result Value   Abs Immature Granulocytes 0.10 (*)    All other components within normal limits  BASIC METABOLIC PANEL  TROPONIN I (HIGH SENSITIVITY)  TROPONIN I (HIGH SENSITIVITY)     EKG  NSR rate of 69. Normal QRS. Normal QT. Normal  axis.   RADIOLOGY   PROCEDURES:  Critical Care performed: No  Procedures   MEDICATIONS ORDERED IN ED: Medications - No data to display   IMPRESSION / MDM / [place] / ED COURSE  I reviewed the triage vital signs and the nursing notes.                              Differential diagnosis includes, but is not limited to hypertensive urgency, hypertension, cardiac event.  Patient's presentation is most consistent with acute complicated illness / injury requiring diagnostic workup.  41 year old male presenting to the emergency department for evaluation of symptomatic hypertension. Will get labs and EKG.   Labs are normal. EKG is reassuring. While here, BP decreased without intervention to 137/84. No longer feeling "foggy" or hands shaking. Plan will be to discharge with Clonidine to be used prn symptomatic hypertension. He will call for appointment with primary care. ER return precautions discussed.    FINAL CLINICAL IMPRESSION(S) / ED DIAGNOSES  Final diagnoses:  Hypertension, unspecified type     Rx / DC Orders   ED Discharge Orders          Ordered    cloNIDine (CATAPRES) 0.1 MG tablet  Daily PRN        01/30/22 1742             Note:  This document was prepared using Dragon voice recognition software and may include unintentional dictation errors.   Victorino Dike, FNP 01/30/22 Derald Macleod, MD 01/30/22 [phone]

## 2022-01-30 NOTE — ED Triage Notes (Signed)
Presents with family with concerns of elevated b/p

## 2022-01-30 NOTE — Telephone Encounter (Signed)
[facility] - Client TELEPHONE ADVICE RECORD Access[facility]urse Patient Name: [name] ANT Gender: Male DOB: [birth year] Age: 41 Y 11 M 23 D Return Phone Number: [phone] (Primary) Address: City/ State/ Zip: [place]  [address] Client [place] [facility] - Client Client Site [facility] - Day Provider Viviana Simpler- MD Contact Type Call Who Is Calling Patient / Member / Family / Caregiver Call Type Triage / Clinical Caller Name [name] Relationship To Patient Spouse Return Phone Number [phone] (Primary) Chief Complaint CONFUSION - new onset Reason for Call Symptomatic / Request for [place] states her husband BP reading 162/102. He is somewhat confused Translation No Nurse Assessment Nurse: Dustin Madeira, RN, [name] Date/Time (Eastern Time): 01/30/2022 3:34:31 PM Confirm and document reason for call. If symptomatic, describe symptoms. ---Caller states her husband BP reading 162/102. he is foggy headed . pt hands are shaking. pt BP this past week it has been running high. they have been working on his diet thinking the BP was correlated to anything. Does the patient have any new or worsening symptoms? ---Yes Will a triage be completed? ---Yes Related visit to physician within the last 2 weeks? ---No Does the PT have any chronic conditions? (i.e. diabetes, asthma, this includes High risk factors for pregnancy, etc.) ---No Is this a behavioral health or substance abuse call? ---No Guidelines Guideline Title Affirmed Question Affirmed Notes Nurse Date/Time (Eastern Time) Blood Pressure - High [7] Systolic BP >= 591 OR Diastolic >= 638 AND [4] cardiac (e.g., breathing difficulty, chest pain) or neurologic symptoms (e.g., new-onset blurred or double vision, unsteady gait) [name], RN, [name] 01/30/2022 3:37:45 PM PLEASE NOTE: All timestamps  contained within this report are represented as [place] Standard Time. CONFIDENTIALTY NOTICE: This fax transmission is intended only for the addressee. It contains information that is legally privileged, confidential or otherwise protected from use or disclosure. If you are not the intended recipient, you are strictly prohibited from reviewing, disclosing, copying using or disseminating any of this information or taking any action in reliance on or regarding this information. If you have received this fax in error, please notify us immediately by telephone so that we can arrange for its return to [place]. Phone: [phone], Toll-Free: [phone], Fax: [phone] Page: 2 of 2 Call Id: [id number] [name]. Time [name] Time) Disposition Final User 01/30/2022 3:32:46 PM Send to Urgent Queue [name] 01/30/2022 3:44:29 PM Go to ED Now Yes Dustin Madeira, RN, [name] Final Disposition 01/30/2022 3:44:29 PM Go to ED Now Yes Dustin Madeira, RN, [name] Disagree/Comply Comply Caller Understands Yes PreDisposition InappropriateToAsk Care Advice Given Per Guideline GO TO ED NOW: * You need to be seen in the Emergency Department. * Go to the ED at ___________ [place] now. Drive carefully. NOTE TO TRIAGER - DRIVING: * Another adult should drive. * Patient should not delay going to the emergency department. * If immediate transportation is not available via car, rideshare (e.g., Lyft, Uber), or taxi, then the patient should be instructed to call EMS-[id number]. CALL EMS 911 IF: * Passes out or faints * Becomes confused * Becomes too weak to stand * You become worse CARE ADVICE given per High Blood Pressure (Adult) guideline. Referrals [place]

## 2022-01-31 ENCOUNTER — Telehealth: Payer: Self-pay

## 2022-01-31 NOTE — Telephone Encounter (Signed)
Transition Care Management Follow-up Telephone Call Date of discharge and from where: [place] ED 01/30/2022 How have you been since you were released from the hospital? better Any questions or concerns? No  Items Reviewed: Did the pt receive and understand the discharge instructions provided? Yes  Medications obtained and verified? Yes  Other? No  Any new allergies since your discharge? No  Dietary orders reviewed? Yes Do you have support at home? Yes   Home Care and Equipment/Supplies: Were home health services ordered? no If so, what is the name of the agency? N/a  Has the agency set up a time to come to the patient's home? not applicable Were any new equipment or medical supplies ordered?  No What is the name of the medical supply agency? N/a Were you able to get the supplies/equipment? not applicable Do you have any questions related to the use of the equipment or supplies? No  Functional Questionnaire: (I = Independent and D = Dependent) ADLs: I  Bathing/Dressing- I  Meal Prep- I  Eating- I  Maintaining continence- I  Transferring/Ambulation- I  Managing Meds- I  Follow up appointments reviewed:  PCP Hospital f/u appt confirmed? Yes  Scheduled to see Dr Silvio Pate on 02/08/2022 @ 9:30. [facility] f/u appt confirmed? No   Are transportation arrangements needed? No  If their condition worsens, is the pt aware to call PCP or go to the Emergency Dept.? Yes Was the patient provided with contact information for the PCP's office or ED? Yes Was to pt encouraged to call back with questions or concerns? Yes Juanda Crumble, LPN [place] Direct Dial [phone]

## 2022-02-08 ENCOUNTER — Encounter: Payer: Self-pay | Admitting: Internal Medicine

## 2022-02-08 ENCOUNTER — Ambulatory Visit: Payer: 59 | Admitting: Internal Medicine

## 2022-02-08 VITALS — BP 120/84 | HR 84 | Temp 97.7°F | Ht 72.0 in | Wt 242.0 lb

## 2022-02-08 DIAGNOSIS — Z23 Encounter for immunization: Secondary | ICD-10-CM

## 2022-02-08 DIAGNOSIS — Z0000 Encounter for general adult medical examination without abnormal findings: Secondary | ICD-10-CM | POA: Diagnosis not present

## 2022-02-08 DIAGNOSIS — F339 Major depressive disorder, recurrent, unspecified: Secondary | ICD-10-CM | POA: Diagnosis not present

## 2022-02-08 DIAGNOSIS — R030 Elevated blood-pressure reading, without diagnosis of hypertension: Secondary | ICD-10-CM

## 2022-02-08 NOTE — Assessment & Plan Note (Signed)
Now in remission No Rx for now

## 2022-02-08 NOTE — Assessment & Plan Note (Signed)
Healthy Discussed lifestyle--he has already made changes Td booster today Prefers no COVID or flu (likely just had COVID---would try antibiotic next week if persistent sinus symptoms) No cancer screening yet

## 2022-02-08 NOTE — Progress Notes (Signed)
Subjective:    Patient ID: [name], male    DOB: January 16, 1981, 41 y.o.   MRN: [id number]  HPI Here for ER follow up and physical  Had noticed old BP machine at house Started checking--would be 160/100 Wife tried manual cuff and it was also high Had stayed 150s/90's  Worked on eating healthier--less salt and caffeine Has lost 7-8# in the past few weeks Did get down into 140's/80's mostly  Then he went out for Christmas shopping--and felt "foggy" Wife came and got him Called nurse line and triaged to ER Had EKG, blood--and all okay.  BP elevated but then back into the normal range--so no Rx Did take prn clonidine once  Has been off all mood medicines Marriage has been good--no anger spells (or rare with stress)  Current Outpatient Medications on File Prior to Visit  Medication Sig Dispense Refill   cloNIDine (CATAPRES) 0.1 MG tablet Take 1 tablet (0.1 mg total) by mouth daily as needed. 20 tablet 0   ketoconazole (NIZORAL) 2 % shampoo Apply 1 Application topically See admin instructions. apply three times per week, massage into scalp and leave in for 10 minutes before rinsing out 120 mL 11   Roflumilast (ZORYVE) 0.3 % CREA Apply 1 application  topically at bedtime. 60 g 6   tamsulosin (FLOMAX) 0.4 MG CAPS capsule Take 1 capsule (0.4 mg total) by mouth daily. 30 capsule 3   No current facility-administered medications on file prior to visit.    Allergies  Allergen Reactions   Aripiprazole Other (See Comments)    Tremor, etc   Fluoxetine Other (See Comments)    Just didn't work for him    Past Medical History:  Diagnosis Date   Allergic rhinitis    Narcolepsy    Spondylolisthesis     Past Surgical History:  Procedure Laterality Date   VASECTOMY  1/14   Dr Jacqlyn Larsen    Family History  Problem Relation Age of Onset   Diabetes type I Mother    Hypertension Mother    Uterine cancer Mother    Diabetes Mother    Healthy Father    Hypertension Paternal  Grandmother    Coronary artery disease Neg Hx    Cancer Neg Hx        no colon or prostate    Social History   Socioeconomic History   Marital status: Married    Spouse name: Not on file   Number of children: 1   Years of education: Not on file   Highest education level: Not on file  Occupational History   Occupation: Airline pilot    Comment: [employer]   Occupation: Grading business on the side  Tobacco Use   Smoking status: Never   Smokeless tobacco: Never  Substance and Sexual Activity   Alcohol use: Yes    Alcohol/week: 0.0 standard drinks of alcohol    Comment: occasional   Drug use: Never   Sexual activity: Yes  Other Topics Concern   Not on file  Social History Narrative   Married 2nd time   4 step children   1 child   Social Determinants of Radio broadcast assistant Strain: Not on file  Food Insecurity: Not on file  Transportation Needs: Not on file  Physical Activity: Not on file  Stress: Not on file  Social Connections: Not on file  Intimate Partner Violence: Not on file   Review of Systems  Constitutional:  Negative for fatigue and unexpected weight  change.       Has had mild illness over the past 2 weeks--initially achy, bad 2 days Was better---but hasn't completely resolved Had fever 4 days ago then achy again Now improved but still with green mucus  Wears seat belt   HENT:  Positive for tinnitus. Negative for dental problem and hearing loss.   Eyes:  Negative for visual disturbance.       No diplopia or unilateral vision loss Needs glasses more  Respiratory:  Negative for shortness of breath.        Just mild cough  Cardiovascular:        Some chest pain--substernal and very vague. Not heartburn. Not exertional and random Minor leg swelling--sock indentation when on his feet  Does notice heart beating hard at times--not fast  Gastrointestinal:  Negative for blood in stool and constipation.       No sig heartburn  Endocrine: Negative for  polydipsia and polyuria.  Genitourinary:  Negative for difficulty urinating and urgency.       No sexual problems  Musculoskeletal:  Positive for myalgias. Negative for arthralgias, back pain and joint swelling.       Neck is stiff  Skin:        Recent derm visit Some eczema  Allergic/Immunologic: Negative for environmental allergies and immunocompromised state.  Neurological:  Negative for dizziness, syncope and light-headedness.       Rare sinus headaches  Hematological:  Negative for adenopathy. Does not bruise/bleed easily.  Psychiatric/Behavioral:  Negative for sleep disturbance.        Only mild stress related mood issues Wife concerned about sleep apnea--when weight is up        Objective:   Physical Exam Constitutional:      Appearance: Normal appearance.  HENT:     Mouth/Throat:     Pharynx: No oropharyngeal exudate or posterior oropharyngeal erythema.  Eyes:     Conjunctiva/sclera: Conjunctivae normal.     Pupils: Pupils are equal, round, and reactive to light.  Cardiovascular:     Rate and Rhythm: Normal rate and regular rhythm.     Pulses: Normal pulses.     Heart sounds: No murmur heard.    No gallop.  Pulmonary:     Effort: Pulmonary effort is normal.     Breath sounds: Normal breath sounds. No wheezing or rales.  Abdominal:     Palpations: Abdomen is soft.     Tenderness: There is no abdominal tenderness.  Musculoskeletal:     Cervical back: Neck supple.     Right lower leg: No edema.     Left lower leg: No edema.  Lymphadenopathy:     Cervical: No cervical adenopathy.  Skin:    Findings: No lesion or rash.  Neurological:     General: No focal deficit present.     Mental Status: He is alert and oriented to person, place, and time.  Psychiatric:        Mood and Affect: Mood normal.        Behavior: Behavior normal.            Assessment & Plan:

## 2022-02-08 NOTE — Assessment & Plan Note (Signed)
BP Readings from Last 3 Encounters:  02/08/22 120/84  01/30/22 137/84  01/11/22 (!) 129/93   Repeat 120/78 on right He will monitor at home---if stays up with start valsartan 80

## 2022-02-08 NOTE — Addendum Note (Signed)
Addended by: Pilar Grammes on: 02/08/2022 10:31 AM   Modules accepted: Orders

## 2022-02-10 ENCOUNTER — Encounter: Payer: Self-pay | Admitting: Internal Medicine

## 2022-02-10 MED ORDER — AMOXICILLIN-POT CLAVULANATE 875-125 MG PO TABS: 1.0000 | ORAL_TABLET | Freq: Two times a day (BID) | ORAL | 0 refills | Status: AC

## 2022-07-12 ENCOUNTER — Ambulatory Visit: Payer: 59 | Admitting: Dermatology

## 2022-08-24 ENCOUNTER — Ambulatory Visit: Payer: 59 | Admitting: Dermatology

## 2023-12-06 ENCOUNTER — Other Ambulatory Visit (HOSPITAL_BASED_OUTPATIENT_CLINIC_OR_DEPARTMENT_OTHER): Payer: Self-pay | Admitting: Family Medicine

## 2023-12-06 DIAGNOSIS — Z8249 Family history of ischemic heart disease and other diseases of the circulatory system: Secondary | ICD-10-CM

## 2024-02-15 ENCOUNTER — Ambulatory Visit (HOSPITAL_BASED_OUTPATIENT_CLINIC_OR_DEPARTMENT_OTHER)
Admission: RE | Admit: 2024-02-15 | Discharge: 2024-02-15 | Disposition: A | Discharge status: Home / Self Care | Payer: Self-pay | Source: Ambulatory Visit | Attending: Family Medicine | Admitting: Family Medicine

## 2024-02-15 DIAGNOSIS — Z8249 Family history of ischemic heart disease and other diseases of the circulatory system: Secondary | ICD-10-CM
